# Patient Record
Sex: Female | Born: 1995
Health system: Southern US, Community
[De-identification: ages and names within clinical notes are randomized; demographics above are authoritative.]

## PROBLEM LIST (undated history)

## (undated) DIAGNOSIS — T7422XA Child sexual abuse, confirmed, initial encounter: Secondary | ICD-10-CM

## (undated) DIAGNOSIS — K589 Irritable bowel syndrome without diarrhea: Secondary | ICD-10-CM

## (undated) DIAGNOSIS — F32A Depression, unspecified: Secondary | ICD-10-CM

## (undated) DIAGNOSIS — J45909 Unspecified asthma, uncomplicated: Secondary | ICD-10-CM

## (undated) DIAGNOSIS — G43909 Migraine, unspecified, not intractable, without status migrainosus: Secondary | ICD-10-CM

## (undated) DIAGNOSIS — A749 Chlamydial infection, unspecified: Secondary | ICD-10-CM

## (undated) DIAGNOSIS — G4700 Insomnia, unspecified: Secondary | ICD-10-CM

## (undated) DIAGNOSIS — F419 Anxiety disorder, unspecified: Secondary | ICD-10-CM

## (undated) DIAGNOSIS — F329 Major depressive disorder, single episode, unspecified: Secondary | ICD-10-CM

## (undated) HISTORY — DX: Child sexual abuse, confirmed, initial encounter: T74.22XA

## (undated) HISTORY — DX: Irritable bowel syndrome, unspecified: K58.9

## (undated) HISTORY — DX: Depression, unspecified: F32.A

## (undated) HISTORY — DX: Major depressive disorder, single episode, unspecified: F32.9

## (undated) HISTORY — PX: APPENDECTOMY: SHX54

## (undated) HISTORY — DX: Anxiety disorder, unspecified: F41.9

## (undated) HISTORY — DX: Migraine, unspecified, not intractable, without status migrainosus: G43.909

## (undated) HISTORY — DX: Insomnia, unspecified: G47.00

## (undated) HISTORY — PX: LAPAROSCOPY: SHX197

---
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
## 2016-02-10 ENCOUNTER — Encounter: Payer: Self-pay | Admitting: Family Medicine

## 2016-02-10 ENCOUNTER — Ambulatory Visit (INDEPENDENT_AMBULATORY_CARE_PROVIDER_SITE_OTHER): Payer: Medicaid Other | Admitting: Family Medicine

## 2016-02-10 VITALS — BP 102/70 | HR 104 | Temp 98.4°F | Resp 18 | Wt 128.9 lb

## 2016-02-10 DIAGNOSIS — Z9889 Other specified postprocedural states: Secondary | ICD-10-CM

## 2016-02-10 DIAGNOSIS — M25361 Other instability, right knee: Secondary | ICD-10-CM

## 2016-02-10 DIAGNOSIS — G4700 Insomnia, unspecified: Secondary | ICD-10-CM

## 2016-02-10 DIAGNOSIS — R42 Dizziness and giddiness: Secondary | ICD-10-CM

## 2016-02-10 DIAGNOSIS — J3089 Other allergic rhinitis: Secondary | ICD-10-CM

## 2016-02-10 DIAGNOSIS — F339 Major depressive disorder, recurrent, unspecified: Secondary | ICD-10-CM | POA: Diagnosis not present

## 2016-02-10 DIAGNOSIS — D509 Iron deficiency anemia, unspecified: Secondary | ICD-10-CM

## 2016-02-10 DIAGNOSIS — J309 Allergic rhinitis, unspecified: Secondary | ICD-10-CM

## 2016-02-10 DIAGNOSIS — J4520 Mild intermittent asthma, uncomplicated: Secondary | ICD-10-CM | POA: Diagnosis not present

## 2016-02-10 DIAGNOSIS — J302 Other seasonal allergic rhinitis: Secondary | ICD-10-CM

## 2016-02-10 DIAGNOSIS — Z13220 Encounter for screening for lipoid disorders: Secondary | ICD-10-CM | POA: Diagnosis not present

## 2016-02-10 MED ORDER — DULOXETINE HCL 60 MG PO CPEP: 60.0000 mg | ORAL_CAPSULE | Freq: Every day | ORAL | Status: DC

## 2016-02-10 MED ORDER — CETIRIZINE HCL 10 MG PO TABS: 10.0000 mg | ORAL_TABLET | Freq: Every day | ORAL | Status: DC
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
## 2016-02-18 ENCOUNTER — Other Ambulatory Visit: Payer: Self-pay | Admitting: Family Medicine

## 2016-02-18 LAB — CBC WITH DIFFERENTIAL/PLATELET
BASOS: 0 %
Basophils Absolute: 0 10*3/uL (ref 0.0–0.2)
EOS (ABSOLUTE): 0.1 10*3/uL (ref 0.0–0.4)
Eos: 1 %
Hematocrit: 29.8 % — ABNORMAL LOW (ref 34.0–46.6)
Hemoglobin: 9.2 g/dL — ABNORMAL LOW (ref 11.1–15.9)
IMMATURE GRANULOCYTES: 0 %
Immature Grans (Abs): 0 10*3/uL (ref 0.0–0.1)
LYMPHS ABS: 1.9 10*3/uL (ref 0.7–3.1)
Lymphs: 22 %
MCH: 24.3 pg — ABNORMAL LOW (ref 26.6–33.0)
MCHC: 30.9 g/dL — ABNORMAL LOW (ref 31.5–35.7)
MCV: 79 fL (ref 79–97)
Monocytes Absolute: 0.6 10*3/uL (ref 0.1–0.9)
Monocytes: 7 %
NEUTROS PCT: 70 %
Neutrophils Absolute: 6.3 10*3/uL (ref 1.4–7.0)
PLATELETS: 415 10*3/uL — AB (ref 150–379)
RBC: 3.78 x10E6/uL (ref 3.77–5.28)
RDW: 13.7 % (ref 12.3–15.4)
WBC: 8.9 10*3/uL (ref 3.4–10.8)

## 2016-02-18 LAB — LIPID PANEL
CHOL/HDL RATIO: 2.3 ratio (ref 0.0–4.4)
Cholesterol, Total: 128 mg/dL (ref 100–169)
HDL: 56 mg/dL (ref >39)
LDL CALC: 61 mg/dL (ref 0–109)
Triglycerides: 54 mg/dL (ref 0–89)
VLDL CHOLESTEROL CAL: 11 mg/dL (ref 5–40)

## 2016-02-18 LAB — COMPREHENSIVE METABOLIC PANEL
ALT: 16 IU/L (ref 0–32)
AST: 19 IU/L (ref 0–40)
Albumin/Globulin Ratio: 1.9 (ref 1.1–2.5)
Albumin: 4.1 g/dL (ref 3.5–5.5)
Alkaline Phosphatase: 57 IU/L (ref 43–101)
BILIRUBIN TOTAL: 0.3 mg/dL (ref 0.0–1.2)
BUN/Creatinine Ratio: 10 (ref 8–20)
BUN: 7 mg/dL (ref 6–20)
CALCIUM: 9.2 mg/dL (ref 8.7–10.2)
CHLORIDE: 103 mmol/L (ref 96–106)
CO2: 20 mmol/L (ref 18–29)
Creatinine, Ser: 0.7 mg/dL (ref 0.57–1.00)
GFR calc Af Amer: 146 mL/min/{1.73_m2} (ref >59)
GFR calc non Af Amer: 127 mL/min/{1.73_m2} (ref >59)
GLOBULIN, TOTAL: 2.2 g/dL (ref 1.5–4.5)
Glucose: 80 mg/dL (ref 65–99)
POTASSIUM: 4.4 mmol/L (ref 3.5–5.2)
SODIUM: 140 mmol/L (ref 134–144)
Total Protein: 6.3 g/dL (ref 6.0–8.5)

## 2016-02-18 LAB — FERRITIN: Ferritin: 5 ng/mL — ABNORMAL LOW (ref 15–77)

## 2016-02-18 LAB — TSH: TSH: 1.37 u[IU]/mL (ref 0.450–4.500)

## 2016-02-18 MED ORDER — FERROUS SULFATE 325 (65 FE) MG PO TBEC: 325.0000 mg | DELAYED_RELEASE_TABLET | Freq: Three times a day (TID) | ORAL | Status: DC

## 2016-04-15 ENCOUNTER — Ambulatory Visit
Admission: EM | Admit: 2016-04-15 | Discharge: 2016-04-15 | Disposition: A | Discharge status: Home / Self Care | Payer: Medicaid Other | Attending: Family Medicine | Admitting: Family Medicine

## 2016-04-15 ENCOUNTER — Encounter: Payer: Self-pay | Admitting: Gynecology

## 2016-04-15 DIAGNOSIS — J45909 Unspecified asthma, uncomplicated: Secondary | ICD-10-CM | POA: Insufficient documentation

## 2016-04-15 DIAGNOSIS — J029 Acute pharyngitis, unspecified: Secondary | ICD-10-CM | POA: Diagnosis not present

## 2016-04-15 DIAGNOSIS — G4700 Insomnia, unspecified: Secondary | ICD-10-CM | POA: Insufficient documentation

## 2016-04-15 DIAGNOSIS — F329 Major depressive disorder, single episode, unspecified: Secondary | ICD-10-CM | POA: Insufficient documentation

## 2016-04-15 DIAGNOSIS — K589 Irritable bowel syndrome without diarrhea: Secondary | ICD-10-CM | POA: Insufficient documentation

## 2016-04-15 LAB — RAPID STREP SCREEN (MED CTR MEBANE ONLY): STREPTOCOCCUS, GROUP A SCREEN (DIRECT): NEGATIVE

## 2016-04-15 MED ORDER — IBUPROFEN 800 MG PO TABS: 800.0000 mg | ORAL_TABLET | Freq: Three times a day (TID) | ORAL | Status: DC

## 2016-04-15 NOTE — Discharge Instructions (Signed)
This is viral. Your strep test was negative. Take the ibuprofen as needed. Follow up if you worsen or fail to improve.  Pharyngitis Pharyngitis is redness, pain, and swelling (inflammation) of your pharynx.  CAUSES  Pharyngitis is usually caused by infection. Most of the time, these infections are from viruses (viral) and are part of a cold. However, sometimes pharyngitis is caused by bacteria (bacterial). Pharyngitis can also be caused by allergies. Viral pharyngitis may be spread from person to person by coughing, sneezing, and personal items or utensils (cups, forks, spoons, toothbrushes). Bacterial pharyngitis may be spread from person to person by more intimate contact, such as kissing.  SIGNS AND SYMPTOMS  Symptoms of pharyngitis include:   Sore throat.   Tiredness (fatigue).   Low-grade fever.   Headache.  Joint pain and muscle aches.  Skin rashes.  Swollen lymph nodes.  Plaque-like film on throat or tonsils (often seen with bacterial pharyngitis). DIAGNOSIS  Your health care provider will ask you questions about your illness and your symptoms. Your medical history, along with a physical exam, is often all that is needed to diagnose pharyngitis. Sometimes, a rapid strep test is done. Other lab tests may also be done, depending on the suspected cause.  TREATMENT  Viral pharyngitis will usually get better in 3-4 days without the use of medicine. Bacterial pharyngitis is treated with medicines that kill germs (antibiotics).  HOME CARE INSTRUCTIONS   Drink enough water and fluids to keep your urine clear or pale yellow.   Only take over-the-counter or prescription medicines as directed by your health care provider:   If you are prescribed antibiotics, make sure you finish them even if you start to feel better.   Do not take aspirin.   Get lots of rest.   Gargle with 8 oz of salt water ( tsp of salt per 1 qt of water) as often as every 1-2 hours to soothe your  throat.   Throat lozenges (if you are not at risk for choking) or sprays may be used to soothe your throat. SEEK MEDICAL CARE IF:   You have large, tender lumps in your neck.  You have a rash.  You cough up green, yellow-brown, or bloody spit. SEEK IMMEDIATE MEDICAL CARE IF:   Your neck becomes stiff.  You drool or are unable to swallow liquids.  You vomit or are unable to keep medicines or liquids down.  You have severe pain that does not go away with the use of recommended medicines.  You have trouble breathing (not caused by a stuffy nose). MAKE SURE YOU:   Understand these instructions.  Will watch your condition.  Will get help right away if you are not doing well or get worse.   This information is not intended to replace advice given to you by your health care provider. Make sure you discuss any questions you have with your health care provider.   Document Released: 11/26/2005 Document Revised: 09/16/2013 Document Reviewed: 08/03/2013 Elsevier Interactive Patient Education [employer].
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
## 2016-04-15 NOTE — ED Notes (Signed)
Patient c/o sore throat 3-4 days.

## 2016-04-17 LAB — CULTURE, GROUP A STREP (THRC)

## 2016-05-14 ENCOUNTER — Encounter: Payer: Self-pay | Admitting: Family Medicine

## 2016-05-14 ENCOUNTER — Ambulatory Visit (INDEPENDENT_AMBULATORY_CARE_PROVIDER_SITE_OTHER): Payer: Medicaid Other | Admitting: Family Medicine

## 2016-05-14 VITALS — BP 116/64 | HR 95 | Temp 98.9°F | Resp 18 | Wt 131.1 lb

## 2016-05-14 DIAGNOSIS — D500 Iron deficiency anemia secondary to blood loss (chronic): Secondary | ICD-10-CM | POA: Diagnosis not present

## 2016-05-14 DIAGNOSIS — N920 Excessive and frequent menstruation with regular cycle: Secondary | ICD-10-CM

## 2016-05-14 DIAGNOSIS — N944 Primary dysmenorrhea: Secondary | ICD-10-CM

## 2016-05-14 DIAGNOSIS — F339 Major depressive disorder, recurrent, unspecified: Secondary | ICD-10-CM

## 2016-05-14 DIAGNOSIS — N898 Other specified noninflammatory disorders of vagina: Secondary | ICD-10-CM

## 2016-05-14 DIAGNOSIS — J309 Allergic rhinitis, unspecified: Secondary | ICD-10-CM

## 2016-05-14 DIAGNOSIS — J3089 Other allergic rhinitis: Secondary | ICD-10-CM

## 2016-05-14 DIAGNOSIS — J302 Other seasonal allergic rhinitis: Secondary | ICD-10-CM

## 2016-05-14 DIAGNOSIS — G4700 Insomnia, unspecified: Secondary | ICD-10-CM | POA: Diagnosis not present

## 2016-05-14 DIAGNOSIS — J4520 Mild intermittent asthma, uncomplicated: Secondary | ICD-10-CM | POA: Diagnosis not present

## 2016-05-14 DIAGNOSIS — M25361 Other instability, right knee: Secondary | ICD-10-CM

## 2016-05-14 DIAGNOSIS — Z113 Encounter for screening for infections with a predominantly sexual mode of transmission: Secondary | ICD-10-CM

## 2016-05-14 DIAGNOSIS — L298 Other pruritus: Secondary | ICD-10-CM | POA: Diagnosis not present

## 2016-05-14 MED ORDER — FLUCONAZOLE 150 MG PO TABS: 150.0000 mg | ORAL_TABLET | ORAL | Status: DC

## 2016-05-14 MED ORDER — DULOXETINE HCL 60 MG PO CPEP: 60.0000 mg | ORAL_CAPSULE | Freq: Every day | ORAL | Status: DC

## 2016-05-14 MED ORDER — FERROUS SULFATE 325 (65 FE) MG PO TBEC: 325.0000 mg | DELAYED_RELEASE_TABLET | Freq: Three times a day (TID) | ORAL | Status: DC
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
## 2016-05-15 LAB — HIV ANTIBODY (ROUTINE TESTING W REFLEX): HIV Screen 4th Generation wRfx: NONREACTIVE

## 2016-05-15 LAB — HEMOGLOBIN AND HEMATOCRIT, BLOOD
Hematocrit: 35.8 % (ref 34.0–46.6)
Hemoglobin: 10.5 g/dL — ABNORMAL LOW (ref 11.1–15.9)

## 2016-05-15 LAB — FERRITIN: Ferritin: 6 ng/mL — ABNORMAL LOW (ref 15–77)

## 2016-05-16 LAB — CHLAMYDIA/GONOCOCCUS/TRICHOMONAS, NAA
Chlamydia by NAA: NEGATIVE
Gonococcus by NAA: NEGATIVE
Trich vag by NAA: NEGATIVE

## 2016-06-15 ENCOUNTER — Other Ambulatory Visit: Payer: Self-pay | Admitting: Family Medicine

## 2016-07-05 ENCOUNTER — Telehealth: Payer: Self-pay | Admitting: Family Medicine

## 2016-07-06 NOTE — Telephone Encounter (Signed)
Patient went up to [facility] and spoke with the receptionist and was told to make an appt. Patient made an appointment for Monday 07/09/16 at 9:30 a.m. Due to doctor not being there and when she goes to her appt. Her BC can be changed then.

## 2016-07-16 ENCOUNTER — Encounter: Payer: Self-pay | Admitting: *Deleted

## 2016-07-16 ENCOUNTER — Ambulatory Visit
Admission: EM | Admit: 2016-07-16 | Discharge: 2016-07-16 | Disposition: A | Discharge status: Home / Self Care | Payer: Medicaid Other | Attending: Family Medicine | Admitting: Family Medicine

## 2016-07-16 DIAGNOSIS — Z91410 Personal history of adult physical and sexual abuse: Secondary | ICD-10-CM | POA: Diagnosis not present

## 2016-07-16 DIAGNOSIS — F339 Major depressive disorder, recurrent, unspecified: Secondary | ICD-10-CM | POA: Diagnosis not present

## 2016-07-16 DIAGNOSIS — K581 Irritable bowel syndrome with constipation: Secondary | ICD-10-CM | POA: Insufficient documentation

## 2016-07-16 DIAGNOSIS — G4700 Insomnia, unspecified: Secondary | ICD-10-CM | POA: Insufficient documentation

## 2016-07-16 DIAGNOSIS — D500 Iron deficiency anemia secondary to blood loss (chronic): Secondary | ICD-10-CM | POA: Diagnosis not present

## 2016-07-16 DIAGNOSIS — J4520 Mild intermittent asthma, uncomplicated: Secondary | ICD-10-CM | POA: Diagnosis not present

## 2016-07-16 DIAGNOSIS — R350 Frequency of micturition: Secondary | ICD-10-CM | POA: Diagnosis present

## 2016-07-16 DIAGNOSIS — R300 Dysuria: Secondary | ICD-10-CM | POA: Diagnosis present

## 2016-07-16 DIAGNOSIS — N390 Urinary tract infection, site not specified: Secondary | ICD-10-CM | POA: Insufficient documentation

## 2016-07-16 DIAGNOSIS — R3915 Urgency of urination: Secondary | ICD-10-CM | POA: Diagnosis present

## 2016-07-16 LAB — URINALYSIS COMPLETE WITH MICROSCOPIC (ARMC ONLY)
BILIRUBIN URINE: NEGATIVE
GLUCOSE, UA: NEGATIVE mg/dL
Ketones, ur: NEGATIVE mg/dL
Nitrite: NEGATIVE
Protein, ur: 100 mg/dL — AB
Specific Gravity, Urine: 1.025 (ref 1.005–1.030)
pH: 5.5 (ref 5.0–8.0)

## 2016-07-16 LAB — PREGNANCY, URINE: PREG TEST UR: NEGATIVE

## 2016-07-16 MED ORDER — SULFAMETHOXAZOLE-TRIMETHOPRIM 800-160 MG PO TABS: 1.0000 | ORAL_TABLET | Freq: Two times a day (BID) | ORAL | 0 refills | Status: AC

## 2016-07-16 NOTE — ED Triage Notes (Signed)
Dysuria, urinary urgency/frequency since Saturday.

## 2016-07-16 NOTE — Discharge Instructions (Signed)
Take medication as prescribed. Rest. Drink plenty of fluids.  ° °Follow up with your primary care physician this week as discussed. Return to Urgent care for new or worsening concerns.  ° °
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
## 2016-07-18 LAB — URINE CULTURE

## 2016-08-07 ENCOUNTER — Encounter: Payer: Self-pay | Admitting: Emergency Medicine

## 2016-08-07 ENCOUNTER — Ambulatory Visit
Admission: EM | Admit: 2016-08-07 | Discharge: 2016-08-07 | Disposition: A | Discharge status: Home / Self Care | Payer: Medicaid Other | Attending: Family Medicine | Admitting: Family Medicine

## 2016-08-07 DIAGNOSIS — A499 Bacterial infection, unspecified: Secondary | ICD-10-CM

## 2016-08-07 DIAGNOSIS — B9689 Other specified bacterial agents as the cause of diseases classified elsewhere: Secondary | ICD-10-CM

## 2016-08-07 DIAGNOSIS — N898 Other specified noninflammatory disorders of vagina: Secondary | ICD-10-CM | POA: Diagnosis present

## 2016-08-07 DIAGNOSIS — N760 Acute vaginitis: Secondary | ICD-10-CM

## 2016-08-07 LAB — CHLAMYDIA/NGC RT PCR (ARMC ONLY)
CHLAMYDIA TR: NOT DETECTED
N GONORRHOEAE: NOT DETECTED

## 2016-08-07 LAB — PREGNANCY, URINE: PREG TEST UR: NEGATIVE

## 2016-08-07 LAB — WET PREP, GENITAL
Sperm: NONE SEEN
Trich, Wet Prep: NONE SEEN
YEAST WET PREP: NONE SEEN

## 2016-08-07 MED ORDER — METRONIDAZOLE 500 MG PO TABS: 500.0000 mg | ORAL_TABLET | Freq: Two times a day (BID) | ORAL | 0 refills | Status: DC
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
## 2016-08-07 NOTE — ED Triage Notes (Signed)
Patient denies itching.  Patient reports vaginal discharge for a week.

## 2016-08-08 LAB — RPR: RPR Ser Ql: NONREACTIVE

## 2016-08-08 LAB — HIV ANTIBODY (ROUTINE TESTING W REFLEX): HIV Screen 4th Generation wRfx: NONREACTIVE

## 2016-08-09 ENCOUNTER — Telehealth: Payer: Self-pay | Admitting: *Deleted

## 2016-08-09 NOTE — Telephone Encounter (Signed)
Called patient and informed her that her RPR HIV GC and chlamydia results came back negative. Patient confirmed understanding of information.

## 2016-08-14 ENCOUNTER — Ambulatory Visit (INDEPENDENT_AMBULATORY_CARE_PROVIDER_SITE_OTHER): Payer: Medicaid Other | Admitting: Family Medicine

## 2016-08-14 ENCOUNTER — Encounter: Payer: Self-pay | Admitting: Family Medicine

## 2016-08-14 VITALS — BP 118/60 | HR 111 | Temp 98.5°F | Resp 18 | Ht 67.0 in | Wt 127.5 lb

## 2016-08-14 DIAGNOSIS — R55 Syncope and collapse: Secondary | ICD-10-CM

## 2016-08-14 DIAGNOSIS — J302 Other seasonal allergic rhinitis: Secondary | ICD-10-CM

## 2016-08-14 DIAGNOSIS — F339 Major depressive disorder, recurrent, unspecified: Secondary | ICD-10-CM | POA: Diagnosis not present

## 2016-08-14 DIAGNOSIS — G4700 Insomnia, unspecified: Secondary | ICD-10-CM

## 2016-08-14 DIAGNOSIS — L819 Disorder of pigmentation, unspecified: Secondary | ICD-10-CM

## 2016-08-14 DIAGNOSIS — J4520 Mild intermittent asthma, uncomplicated: Secondary | ICD-10-CM | POA: Diagnosis not present

## 2016-08-14 DIAGNOSIS — J309 Allergic rhinitis, unspecified: Secondary | ICD-10-CM | POA: Diagnosis not present

## 2016-08-14 DIAGNOSIS — Z23 Encounter for immunization: Secondary | ICD-10-CM | POA: Diagnosis not present

## 2016-08-14 DIAGNOSIS — L818 Other specified disorders of pigmentation: Secondary | ICD-10-CM

## 2016-08-14 DIAGNOSIS — S83001D Unspecified subluxation of right patella, subsequent encounter: Secondary | ICD-10-CM | POA: Diagnosis not present

## 2016-08-14 DIAGNOSIS — D500 Iron deficiency anemia secondary to blood loss (chronic): Secondary | ICD-10-CM | POA: Diagnosis not present

## 2016-08-14 DIAGNOSIS — J3089 Other allergic rhinitis: Secondary | ICD-10-CM

## 2016-08-14 LAB — CBC WITH DIFFERENTIAL/PLATELET
Basophils Absolute: 0 cells/uL (ref 0–200)
Basophils Relative: 0 %
Eosinophils Absolute: 0 cells/uL — ABNORMAL LOW (ref 15–500)
Eosinophils Relative: 0 %
HCT: 35.6 % (ref 35.0–45.0)
Hemoglobin: 11 g/dL — ABNORMAL LOW (ref 11.7–15.5)
Lymphocytes Relative: 18 %
Lymphs Abs: 1422 cells/uL (ref 850–3900)
MCH: 23.5 pg — ABNORMAL LOW (ref 27.0–33.0)
MCHC: 30.9 g/dL — ABNORMAL LOW (ref 32.0–36.0)
MCV: 76.1 fL — ABNORMAL LOW (ref 80.0–100.0)
MPV: 10.2 fL (ref 7.5–12.5)
Monocytes Absolute: 553 cells/uL (ref 200–950)
Monocytes Relative: 7 %
Neutro Abs: 5925 cells/uL (ref 1500–7800)
Neutrophils Relative %: 75 %
Platelets: 414 10*3/uL — ABNORMAL HIGH (ref 140–400)
RBC: 4.68 MIL/uL (ref 3.80–5.10)
RDW: 15.2 % — ABNORMAL HIGH (ref 11.0–15.0)
WBC: 7.9 10*3/uL (ref 3.8–10.8)

## 2016-08-14 LAB — FERRITIN: Ferritin: 9 ng/mL (ref 6–67)

## 2016-08-14 MED ORDER — DULOXETINE HCL 60 MG PO CPEP: 60.0000 mg | ORAL_CAPSULE | Freq: Every day | ORAL | 2 refills | Status: DC
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
## 2016-08-15 ENCOUNTER — Other Ambulatory Visit: Payer: Self-pay | Admitting: Family Medicine

## 2016-08-15 DIAGNOSIS — R55 Syncope and collapse: Secondary | ICD-10-CM

## 2016-08-16 HISTORY — PX: KNEE SURGERY: SHX244

## 2016-08-22 ENCOUNTER — Other Ambulatory Visit: Payer: Self-pay | Admitting: Family Medicine

## 2016-08-22 DIAGNOSIS — J302 Other seasonal allergic rhinitis: Secondary | ICD-10-CM

## 2016-08-22 DIAGNOSIS — N920 Excessive and frequent menstruation with regular cycle: Secondary | ICD-10-CM

## 2016-08-22 DIAGNOSIS — J3089 Other allergic rhinitis: Principal | ICD-10-CM

## 2016-08-22 NOTE — Telephone Encounter (Signed)
Patient requesting refill of Mononessa and Paxil be sent to [facility].

## 2016-08-22 NOTE — Telephone Encounter (Signed)
Patient requesting refill of Cetirizine to [facility].

## 2016-08-29 ENCOUNTER — Emergency Department: Payer: Medicaid Other

## 2016-08-29 ENCOUNTER — Other Ambulatory Visit: Payer: Self-pay | Admitting: Family Medicine

## 2016-08-29 ENCOUNTER — Emergency Department
Admission: EM | Admit: 2016-08-29 | Discharge: 2016-08-30 | Disposition: A | Discharge status: Home / Self Care | Payer: Medicaid Other | Attending: Emergency Medicine | Admitting: Emergency Medicine

## 2016-08-29 ENCOUNTER — Encounter: Payer: Self-pay | Admitting: Radiology

## 2016-08-29 DIAGNOSIS — R079 Chest pain, unspecified: Secondary | ICD-10-CM | POA: Insufficient documentation

## 2016-08-29 DIAGNOSIS — J45909 Unspecified asthma, uncomplicated: Secondary | ICD-10-CM | POA: Diagnosis not present

## 2016-08-29 DIAGNOSIS — R0600 Dyspnea, unspecified: Secondary | ICD-10-CM | POA: Diagnosis not present

## 2016-08-29 DIAGNOSIS — Z79899 Other long term (current) drug therapy: Secondary | ICD-10-CM | POA: Insufficient documentation

## 2016-08-29 DIAGNOSIS — R0602 Shortness of breath: Secondary | ICD-10-CM | POA: Insufficient documentation

## 2016-08-29 DIAGNOSIS — M79604 Pain in right leg: Secondary | ICD-10-CM | POA: Diagnosis not present

## 2016-08-29 DIAGNOSIS — Z791 Long term (current) use of non-steroidal anti-inflammatories (NSAID): Secondary | ICD-10-CM | POA: Insufficient documentation

## 2016-08-29 LAB — CBC WITH DIFFERENTIAL/PLATELET
BASOS PCT: 0 %
Basophils Absolute: 0 10*3/uL (ref 0–0.1)
Eosinophils Absolute: 0 10*3/uL (ref 0–0.7)
Eosinophils Relative: 0 %
HEMATOCRIT: 32 % — AB (ref 35.0–47.0)
HEMOGLOBIN: 10.3 g/dL — AB (ref 12.0–16.0)
LYMPHS ABS: 2 10*3/uL (ref 1.0–3.6)
LYMPHS PCT: 21 %
MCH: 24.1 pg — AB (ref 26.0–34.0)
MCHC: 32.2 g/dL (ref 32.0–36.0)
MCV: 74.7 fL — AB (ref 80.0–100.0)
MONO ABS: 0.6 10*3/uL (ref 0.2–0.9)
Monocytes Relative: 6 %
NEUTROS ABS: 7 10*3/uL — AB (ref 1.4–6.5)
Neutrophils Relative %: 73 %
Platelets: 474 10*3/uL — ABNORMAL HIGH (ref 150–440)
RBC: 4.28 MIL/uL (ref 3.80–5.20)
RDW: 15 % — AB (ref 11.5–14.5)
WBC: 9.7 10*3/uL (ref 3.6–11.0)

## 2016-08-29 LAB — BASIC METABOLIC PANEL
Anion gap: 7 (ref 5–15)
BUN: 10 mg/dL (ref 6–20)
CALCIUM: 9.4 mg/dL (ref 8.9–10.3)
CHLORIDE: 110 mmol/L (ref 101–111)
CO2: 23 mmol/L (ref 22–32)
CREATININE: 0.61 mg/dL (ref 0.44–1.00)
GFR calc Af Amer: >60 mL/min (ref >60)
GFR calc non Af Amer: >60 mL/min (ref >60)
GLUCOSE: 119 mg/dL — AB (ref 65–99)
Potassium: 3.4 mmol/L — ABNORMAL LOW (ref 3.5–5.1)
Sodium: 140 mmol/L (ref 135–145)

## 2016-08-29 LAB — FIBRIN DERIVATIVES D-DIMER (ARMC ONLY): Fibrin derivatives D-dimer (ARMC): 1314 — ABNORMAL HIGH (ref 0–499)

## 2016-08-29 LAB — HCG, QUANTITATIVE, PREGNANCY

## 2016-08-29 LAB — TROPONIN I: Troponin I: <0.03 ng/mL (ref <0.03)

## 2016-08-29 MED ORDER — FAMOTIDINE 20 MG PO TABS: 20.0000 mg | ORAL_TABLET | Freq: Two times a day (BID) | ORAL | 1 refills | Status: DC

## 2016-08-29 MED ORDER — ONDANSETRON HCL 4 MG/2ML IJ SOLN
4.0000 mg | Freq: Once | INTRAMUSCULAR | Status: AC
  Administered 2016-08-29: 4 mg via INTRAVENOUS

## 2016-08-29 MED ORDER — TRAMADOL HCL 50 MG PO TABS: 50.0000 mg | ORAL_TABLET | Freq: Four times a day (QID) | ORAL | 0 refills | Status: DC | PRN

## 2016-08-29 MED ORDER — ONDANSETRON 4 MG PO TBDP: 4.0000 mg | ORAL_TABLET | Freq: Once | ORAL | Status: DC

## 2016-08-29 MED ORDER — MORPHINE SULFATE (PF) 4 MG/ML IV SOLN
4.0000 mg | Freq: Once | INTRAVENOUS | Status: AC
  Administered 2016-08-29: 4 mg via INTRAVENOUS
  Filled 2016-08-29: qty 1

## 2016-08-29 MED ORDER — IOPAMIDOL (ISOVUE-370) INJECTION 76%
75.0000 mL | Freq: Once | INTRAVENOUS | Status: AC | PRN
  Administered 2016-08-29: 75 mL via INTRAVENOUS

## 2016-08-29 MED ORDER — ONDANSETRON HCL 4 MG/2ML IJ SOLN
INTRAMUSCULAR | Status: AC
Start: 2016-08-29 — End: 2016-08-29
  Administered 2016-08-29: 4 mg via INTRAVENOUS
  Filled 2016-08-29: qty 2

## 2016-08-29 MED ORDER — ONDANSETRON HCL 4 MG/2ML IJ SOLN
4.0000 mg | Freq: Once | INTRAMUSCULAR | Status: DC
  Filled 2016-08-29: qty 2

## 2016-08-29 NOTE — ED Notes (Signed)
Pt. Transported to CT 

## 2016-08-29 NOTE — ED Triage Notes (Signed)
Pt in with chest pain that goes through to her back that started a few days ago with co shob. Pt has right knee surgery 9/7 pt has had decreased mobility.

## 2016-08-29 NOTE — ED Notes (Signed)
Pt. Returned to tx. room in stable condition with no acute changes since departure from unit for scans.   

## 2016-08-29 NOTE — Telephone Encounter (Signed)
Patient requesting refill of Proair to [facility].

## 2016-08-29 NOTE — ED Notes (Signed)
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
## 2016-09-06 ENCOUNTER — Ambulatory Visit (INDEPENDENT_AMBULATORY_CARE_PROVIDER_SITE_OTHER): Payer: Medicaid Other | Admitting: Neurology

## 2016-09-06 ENCOUNTER — Encounter: Payer: Self-pay | Admitting: Neurology

## 2016-09-06 DIAGNOSIS — IMO0002 Reserved for concepts with insufficient information to code with codable children: Secondary | ICD-10-CM | POA: Insufficient documentation

## 2016-09-06 DIAGNOSIS — R55 Syncope and collapse: Secondary | ICD-10-CM

## 2016-09-06 DIAGNOSIS — G43709 Chronic migraine without aura, not intractable, without status migrainosus: Secondary | ICD-10-CM | POA: Diagnosis not present

## 2016-09-06 MED ORDER — TOPIRAMATE 50 MG PO TABS: 50.0000 mg | ORAL_TABLET | Freq: Two times a day (BID) | ORAL | 11 refills | Status: DC

## 2016-09-06 MED ORDER — RIZATRIPTAN BENZOATE 5 MG PO TBDP: 5.0000 mg | ORAL_TABLET | ORAL | 11 refills | Status: DC | PRN
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
## 2016-09-11 ENCOUNTER — Emergency Department
Admission: EM | Admit: 2016-09-11 | Discharge: 2016-09-12 | Disposition: A | Discharge status: Home / Self Care | Payer: Medicaid Other | Attending: Emergency Medicine | Admitting: Emergency Medicine

## 2016-09-11 ENCOUNTER — Encounter: Payer: Self-pay | Admitting: Emergency Medicine

## 2016-09-11 DIAGNOSIS — Z791 Long term (current) use of non-steroidal anti-inflammatories (NSAID): Secondary | ICD-10-CM | POA: Diagnosis not present

## 2016-09-11 DIAGNOSIS — R51 Headache: Secondary | ICD-10-CM | POA: Insufficient documentation

## 2016-09-11 DIAGNOSIS — R079 Chest pain, unspecified: Secondary | ICD-10-CM | POA: Insufficient documentation

## 2016-09-11 DIAGNOSIS — R55 Syncope and collapse: Secondary | ICD-10-CM | POA: Diagnosis present

## 2016-09-11 DIAGNOSIS — Z79899 Other long term (current) drug therapy: Secondary | ICD-10-CM | POA: Insufficient documentation

## 2016-09-11 DIAGNOSIS — J45909 Unspecified asthma, uncomplicated: Secondary | ICD-10-CM | POA: Insufficient documentation

## 2016-09-11 DIAGNOSIS — R0602 Shortness of breath: Secondary | ICD-10-CM | POA: Diagnosis not present

## 2016-09-11 DIAGNOSIS — Z7951 Long term (current) use of inhaled steroids: Secondary | ICD-10-CM | POA: Insufficient documentation

## 2016-09-11 NOTE — ED Triage Notes (Addendum)
Pt to triage via w/c with no distress noted; pt reports onset dizziness while in shower, then had syncopal episode; c/o frontal HA and persistent dizziness at present; ; denies any recent illness, denies hx of same; denies any injuries from syncopal episode; st hx migraines

## 2016-09-12 ENCOUNTER — Emergency Department: Payer: Medicaid Other

## 2016-09-12 LAB — URINALYSIS COMPLETE WITH MICROSCOPIC (ARMC ONLY)
Bilirubin Urine: NEGATIVE
Glucose, UA: NEGATIVE mg/dL
Hgb urine dipstick: NEGATIVE
Ketones, ur: NEGATIVE mg/dL
Nitrite: NEGATIVE
PH: 6 (ref 5.0–8.0)
PROTEIN: 30 mg/dL — AB
Specific Gravity, Urine: 1.023 (ref 1.005–1.030)

## 2016-09-12 LAB — BASIC METABOLIC PANEL
ANION GAP: 8 (ref 5–15)
BUN: 10 mg/dL (ref 6–20)
CALCIUM: 9.8 mg/dL (ref 8.9–10.3)
CO2: 24 mmol/L (ref 22–32)
Chloride: 110 mmol/L (ref 101–111)
Creatinine, Ser: 0.68 mg/dL (ref 0.44–1.00)
GFR calc Af Amer: >60 mL/min (ref >60)
GFR calc non Af Amer: >60 mL/min (ref >60)
GLUCOSE: 81 mg/dL (ref 65–99)
Potassium: 3.9 mmol/L (ref 3.5–5.1)
Sodium: 142 mmol/L (ref 135–145)

## 2016-09-12 LAB — TROPONIN I: Troponin I: <0.03 ng/mL (ref <0.03)

## 2016-09-12 LAB — CBC
HEMATOCRIT: 36.5 % (ref 35.0–47.0)
HEMOGLOBIN: 11.8 g/dL — AB (ref 12.0–16.0)
MCH: 24.5 pg — AB (ref 26.0–34.0)
MCHC: 32.4 g/dL (ref 32.0–36.0)
MCV: 75.8 fL — ABNORMAL LOW (ref 80.0–100.0)
Platelets: 454 10*3/uL — ABNORMAL HIGH (ref 150–440)
RBC: 4.82 MIL/uL (ref 3.80–5.20)
RDW: 16.1 % — AB (ref 11.5–14.5)
WBC: 12.5 10*3/uL — ABNORMAL HIGH (ref 3.6–11.0)

## 2016-09-12 LAB — URINE DRUG SCREEN, QUALITATIVE (ARMC ONLY)
Amphetamines, Ur Screen: NOT DETECTED — AB
BENZODIAZEPINE, UR SCRN: NOT DETECTED — AB
Barbiturates, Ur Screen: NOT DETECTED — AB
Cannabinoid 50 Ng, Ur ~~LOC~~: NOT DETECTED — AB
Cocaine Metabolite,Ur ~~LOC~~: NOT DETECTED — AB
MDMA (ECSTASY) UR SCREEN: NOT DETECTED — AB
METHADONE SCREEN, URINE: NOT DETECTED — AB
Opiate, Ur Screen: NOT DETECTED — AB
Phencyclidine (PCP) Ur S: NOT DETECTED — AB
TRICYCLIC, UR SCREEN: NOT DETECTED — AB

## 2016-09-12 LAB — POCT PREGNANCY, URINE: PREG TEST UR: NEGATIVE

## 2016-09-12 MED ORDER — BUTALBITAL-APAP-CAFFEINE 50-325-40 MG PO TABS
ORAL_TABLET | ORAL | Status: AC
  Administered 2016-09-12: 2 via ORAL
  Filled 2016-09-12: qty 2

## 2016-09-12 MED ORDER — BUTALBITAL-APAP-CAFFEINE 50-325-40 MG PO TABS
2.0000 | ORAL_TABLET | Freq: Once | ORAL | Status: AC
  Administered 2016-09-12: 2 via ORAL

## 2016-09-12 NOTE — ED Notes (Signed)
Patient transported to CT 

## 2016-09-12 NOTE — ED Notes (Signed)
Patient urged to urinate, collection bowl placed in toilet.
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
## 2016-09-12 NOTE — ED Notes (Addendum)
I contacted Radiology to see what is the delay on interpretation of this patient's images.

## 2016-09-17 ENCOUNTER — Ambulatory Visit (INDEPENDENT_AMBULATORY_CARE_PROVIDER_SITE_OTHER): Payer: Medicaid Other | Admitting: Neurology

## 2016-09-17 DIAGNOSIS — R55 Syncope and collapse: Secondary | ICD-10-CM

## 2016-09-17 DIAGNOSIS — G43709 Chronic migraine without aura, not intractable, without status migrainosus: Secondary | ICD-10-CM

## 2016-09-17 DIAGNOSIS — IMO0002 Reserved for concepts with insufficient information to code with codable children: Secondary | ICD-10-CM

## 2016-09-18 ENCOUNTER — Ambulatory Visit
Admission: RE | Admit: 2016-09-18 | Discharge: 2016-09-18 | Disposition: A | Discharge status: Home / Self Care | Payer: Medicaid Other | Source: Ambulatory Visit | Attending: Neurology | Admitting: Neurology

## 2016-09-18 DIAGNOSIS — G43709 Chronic migraine without aura, not intractable, without status migrainosus: Secondary | ICD-10-CM

## 2016-09-18 DIAGNOSIS — R55 Syncope and collapse: Secondary | ICD-10-CM | POA: Diagnosis not present

## 2016-09-18 DIAGNOSIS — IMO0002 Reserved for concepts with insufficient information to code with codable children: Secondary | ICD-10-CM

## 2016-09-18 MED ORDER — GADOBENATE DIMEGLUMINE 529 MG/ML IV SOLN
10.0000 mL | Freq: Once | INTRAVENOUS | Status: AC | PRN
  Administered 2016-09-18: 10 mL via INTRAVENOUS

## 2016-09-20 ENCOUNTER — Telehealth: Payer: Self-pay

## 2016-09-20 NOTE — Telephone Encounter (Signed)
Tried to call pt again but unreachable and not able to leave mssg. If/when she calls again, can let her know that MRI was normal.

## 2016-09-20 NOTE — Telephone Encounter (Signed)
Pt returned call

## 2016-09-20 NOTE — Telephone Encounter (Signed)
Attempted to reach pt to give MRI results but unsuccessful. VM box is not set up to leave mssg. Will try again later.

## 2016-09-20 NOTE — Telephone Encounter (Signed)
-----   Message from Marcial Pacas, MD sent at 09/20/2016  9:11 AM EDT ----- Please call pt for normal MRI brain.
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
## 2016-09-27 ENCOUNTER — Other Ambulatory Visit: Payer: Medicaid Other

## 2016-10-04 ENCOUNTER — Other Ambulatory Visit: Payer: Self-pay | Admitting: Family Medicine

## 2016-10-04 DIAGNOSIS — N920 Excessive and frequent menstruation with regular cycle: Secondary | ICD-10-CM

## 2016-10-04 NOTE — Telephone Encounter (Signed)
Patient requesting refill of Mononessa and Proair to [facility].

## 2016-10-15 ENCOUNTER — Telehealth: Payer: Self-pay | Admitting: Family Medicine

## 2016-10-15 ENCOUNTER — Other Ambulatory Visit: Payer: Self-pay | Admitting: Family Medicine

## 2016-10-15 MED ORDER — TRAZODONE HCL 50 MG PO TABS: 25.0000 mg | ORAL_TABLET | Freq: Every evening | ORAL | 0 refills | Status: DC | PRN

## 2016-10-15 NOTE — Telephone Encounter (Signed)
Pt was on trazodone 50 mg tablet

## 2016-10-15 NOTE — Telephone Encounter (Signed)
Patient is having a hard time sleeping and is wanting to know if you could put her back on her sleeping medication (not sure of the name but you had taken her off of it). Please send to [facility]

## 2016-10-15 NOTE — Telephone Encounter (Signed)
done

## 2016-10-16 NOTE — Telephone Encounter (Signed)
Patient verbally informed °

## 2016-10-22 ENCOUNTER — Encounter: Payer: Self-pay | Admitting: *Deleted

## 2016-10-22 ENCOUNTER — Ambulatory Visit: Payer: Medicaid Other

## 2016-10-22 ENCOUNTER — Ambulatory Visit
Admission: EM | Admit: 2016-10-22 | Discharge: 2016-10-22 | Disposition: A | Discharge status: Home / Self Care | Payer: Medicaid Other | Attending: Family Medicine | Admitting: Family Medicine

## 2016-10-22 DIAGNOSIS — R3915 Urgency of urination: Secondary | ICD-10-CM | POA: Diagnosis present

## 2016-10-22 DIAGNOSIS — R300 Dysuria: Secondary | ICD-10-CM | POA: Insufficient documentation

## 2016-10-22 DIAGNOSIS — R109 Unspecified abdominal pain: Secondary | ICD-10-CM

## 2016-10-22 LAB — URINALYSIS COMPLETE WITH MICROSCOPIC (ARMC ONLY)
Bilirubin Urine: NEGATIVE
GLUCOSE, UA: NEGATIVE mg/dL
KETONES UR: NEGATIVE mg/dL
LEUKOCYTES UA: NEGATIVE
NITRITE: NEGATIVE
Protein, ur: 100 mg/dL — AB
pH: 6 (ref 5.0–8.0)

## 2016-10-22 LAB — PREGNANCY, URINE: PREG TEST UR: NEGATIVE

## 2016-10-22 MED ORDER — CIPROFLOXACIN HCL 500 MG PO TABS: 500.0000 mg | ORAL_TABLET | Freq: Two times a day (BID) | ORAL | 0 refills | Status: AC

## 2016-10-22 NOTE — Discharge Instructions (Signed)
Take medication as prescribed. Rest. Drink plenty of fluids.  ° °Follow up with your primary care physician this week. Return to Urgent care for new or worsening concerns.  ° °
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
## 2016-10-22 NOTE — ED Triage Notes (Signed)
Urinary urgency, and left flank pain x2 days. Denies fever, nausea. One previous UTI.

## 2016-10-25 ENCOUNTER — Telehealth: Payer: Self-pay | Admitting: *Deleted

## 2016-10-25 LAB — URINE CULTURE

## 2016-10-25 NOTE — Telephone Encounter (Signed)
Called patient, verified DOB, communicated positive urine culture results. Patient reported feeling much better. Advised patient to follow up with PCP or [facility] if symptoms return.

## 2016-11-10 ENCOUNTER — Emergency Department
Admission: EM | Admit: 2016-11-10 | Discharge: 2016-11-10 | Disposition: A | Discharge status: Home / Self Care | Payer: Medicaid Other | Attending: Emergency Medicine | Admitting: Emergency Medicine

## 2016-11-10 ENCOUNTER — Encounter: Payer: Self-pay | Admitting: Emergency Medicine

## 2016-11-10 ENCOUNTER — Emergency Department: Payer: Medicaid Other

## 2016-11-10 DIAGNOSIS — Y999 Unspecified external cause status: Secondary | ICD-10-CM | POA: Diagnosis not present

## 2016-11-10 DIAGNOSIS — J45909 Unspecified asthma, uncomplicated: Secondary | ICD-10-CM | POA: Insufficient documentation

## 2016-11-10 DIAGNOSIS — Y9389 Activity, other specified: Secondary | ICD-10-CM | POA: Diagnosis not present

## 2016-11-10 DIAGNOSIS — Y929 Unspecified place or not applicable: Secondary | ICD-10-CM | POA: Diagnosis not present

## 2016-11-10 DIAGNOSIS — S8992XA Unspecified injury of left lower leg, initial encounter: Secondary | ICD-10-CM | POA: Diagnosis present

## 2016-11-10 DIAGNOSIS — T148XXA Other injury of unspecified body region, initial encounter: Secondary | ICD-10-CM

## 2016-11-10 DIAGNOSIS — S86912A Strain of unspecified muscle(s) and tendon(s) at lower leg level, left leg, initial encounter: Secondary | ICD-10-CM | POA: Insufficient documentation

## 2016-11-10 DIAGNOSIS — X58XXXA Exposure to other specified factors, initial encounter: Secondary | ICD-10-CM | POA: Diagnosis not present

## 2016-11-10 DIAGNOSIS — Z79899 Other long term (current) drug therapy: Secondary | ICD-10-CM | POA: Diagnosis not present

## 2016-11-10 DIAGNOSIS — M79662 Pain in left lower leg: Secondary | ICD-10-CM

## 2016-11-10 MED ORDER — NAPROXEN 500 MG PO TABS: 500.0000 mg | ORAL_TABLET | Freq: Two times a day (BID) | ORAL | 0 refills | Status: DC

## 2016-11-10 MED ORDER — BACLOFEN 10 MG PO TABS: 10.0000 mg | ORAL_TABLET | Freq: Three times a day (TID) | ORAL | 0 refills | Status: DC

## 2016-11-10 NOTE — ED Triage Notes (Signed)
Pt to ed with c/o right leg pain.  Pt states it staretd after physical therapy on Thursday, worse pain today than yesterday, especially with ambulation.
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
## 2016-11-10 NOTE — ED Notes (Signed)
See triage note. Pt states she thinks she pulled a muscle in her R calf after PT on Thursday. States she is unable to put weight on R leg and it is tender to palpation.

## 2016-11-10 NOTE — ED Notes (Signed)
Patient transported to Ultrasound 

## 2016-11-13 ENCOUNTER — Ambulatory Visit (INDEPENDENT_AMBULATORY_CARE_PROVIDER_SITE_OTHER): Payer: Medicaid Other | Admitting: Family Medicine

## 2016-11-13 ENCOUNTER — Encounter: Payer: Self-pay | Admitting: Family Medicine

## 2016-11-13 VITALS — BP 112/64 | HR 86 | Temp 98.4°F | Resp 16 | Ht 67.0 in | Wt 133.5 lb

## 2016-11-13 DIAGNOSIS — L819 Disorder of pigmentation, unspecified: Secondary | ICD-10-CM

## 2016-11-13 DIAGNOSIS — R55 Syncope and collapse: Secondary | ICD-10-CM

## 2016-11-13 DIAGNOSIS — L818 Other specified disorders of pigmentation: Secondary | ICD-10-CM | POA: Diagnosis not present

## 2016-11-13 DIAGNOSIS — D500 Iron deficiency anemia secondary to blood loss (chronic): Secondary | ICD-10-CM | POA: Diagnosis not present

## 2016-11-13 DIAGNOSIS — G4709 Other insomnia: Secondary | ICD-10-CM | POA: Diagnosis not present

## 2016-11-13 DIAGNOSIS — K625 Hemorrhage of anus and rectum: Secondary | ICD-10-CM

## 2016-11-13 DIAGNOSIS — F339 Major depressive disorder, recurrent, unspecified: Secondary | ICD-10-CM | POA: Diagnosis not present

## 2016-11-13 DIAGNOSIS — Z9889 Other specified postprocedural states: Secondary | ICD-10-CM | POA: Diagnosis not present

## 2016-11-13 DIAGNOSIS — K581 Irritable bowel syndrome with constipation: Secondary | ICD-10-CM | POA: Diagnosis not present

## 2016-11-13 DIAGNOSIS — J4520 Mild intermittent asthma, uncomplicated: Secondary | ICD-10-CM | POA: Diagnosis not present

## 2016-11-13 LAB — POC HEMOCCULT BLD/STL (OFFICE/1-CARD/DIAGNOSTIC): FECAL OCCULT BLD: NEGATIVE

## 2016-11-13 MED ORDER — TRAZODONE HCL 50 MG PO TABS: 25.0000 mg | ORAL_TABLET | Freq: Every evening | ORAL | 5 refills | Status: DC | PRN

## 2016-11-13 MED ORDER — DULOXETINE HCL 60 MG PO CPEP: 60.0000 mg | ORAL_CAPSULE | Freq: Every day | ORAL | 5 refills | Status: DC

## 2016-11-13 MED ORDER — HYDROCORTISONE ACETATE 25 MG RE SUPP: 25.0000 mg | Freq: Two times a day (BID) | RECTAL | 0 refills | Status: DC
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
## 2016-12-13 ENCOUNTER — Encounter: Payer: Self-pay | Admitting: Neurology

## 2016-12-13 ENCOUNTER — Ambulatory Visit (INDEPENDENT_AMBULATORY_CARE_PROVIDER_SITE_OTHER): Payer: Medicaid Other | Admitting: Neurology

## 2016-12-13 VITALS — BP 107/75 | HR 84 | Ht 67.0 in | Wt 131.0 lb

## 2016-12-13 DIAGNOSIS — IMO0002 Reserved for concepts with insufficient information to code with codable children: Secondary | ICD-10-CM

## 2016-12-13 DIAGNOSIS — R55 Syncope and collapse: Secondary | ICD-10-CM

## 2016-12-13 DIAGNOSIS — G43709 Chronic migraine without aura, not intractable, without status migrainosus: Secondary | ICD-10-CM
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
## 2017-01-28 ENCOUNTER — Other Ambulatory Visit: Payer: Self-pay | Admitting: Family Medicine

## 2017-01-28 DIAGNOSIS — J302 Other seasonal allergic rhinitis: Secondary | ICD-10-CM

## 2017-01-28 DIAGNOSIS — J3089 Other allergic rhinitis: Principal | ICD-10-CM

## 2017-01-28 NOTE — Telephone Encounter (Signed)
Patient requesting refill of Zyrtec to [facility].

## 2017-03-23 IMAGING — CT CT ANGIO CHEST
2 of 6 series · 19 of 46 positions shown · IV contrast (APPLIED)
Comparison: None.

CLINICAL DATA: Chest pain through to the back starting a few days
ago. Shortness of breath. Right knee surgery on 08/16/2016.
Increased D-dimer.

EXAM:
CT ANGIOGRAPHY CHEST WITH CONTRAST
TECHNIQUE: Multidetector CT imaging of the chest was performed using the
standard protocol during bolus administration of intravenous
contrast. Multiplanar CT image reconstructions and MIPs were
obtained to evaluate the vascular anatomy.
CONTRAST:  75 mL Isovue 370

[Series 5: thins · axial · 0.63mm/px · z∈[-622,-404]mm · 17 of 240 slices shown]
[im 11/240  lung]
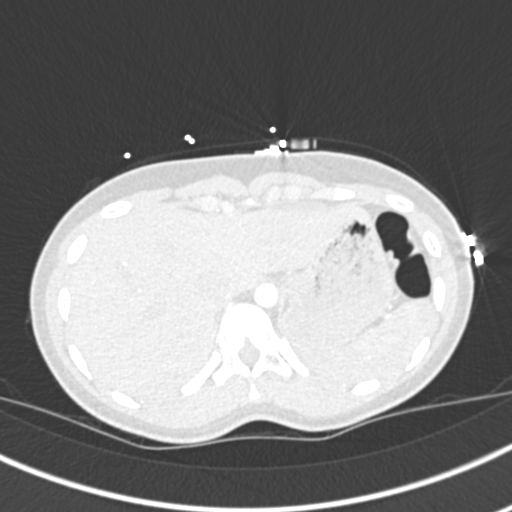
[im 21/240  soft-tissue]
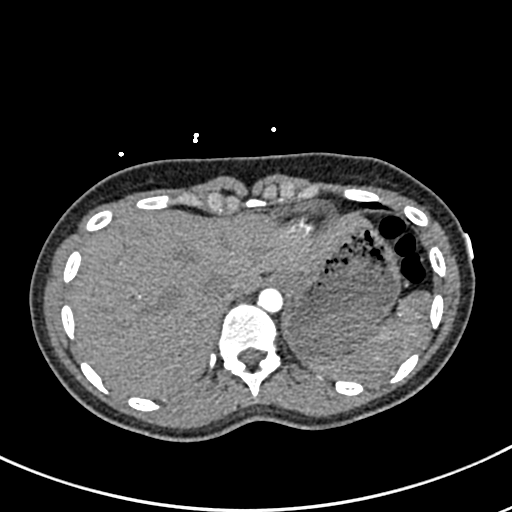
[im 42/240  lung]
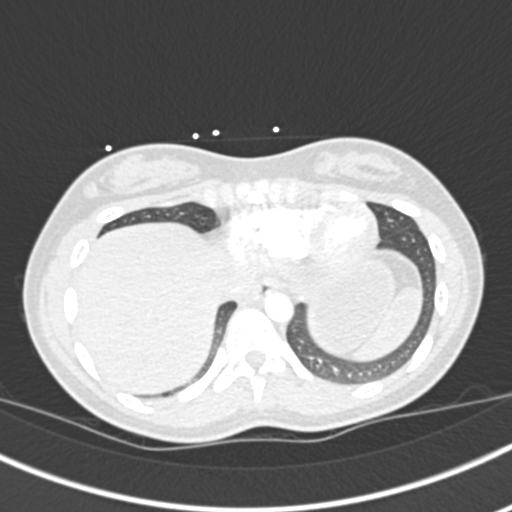
[im 52/240  soft-tissue]
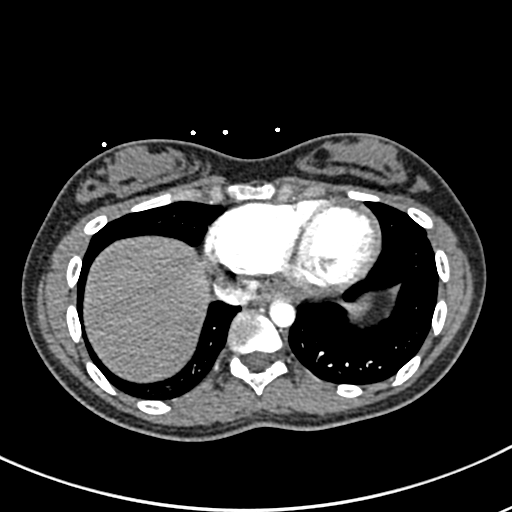
[im 63/240  lung]
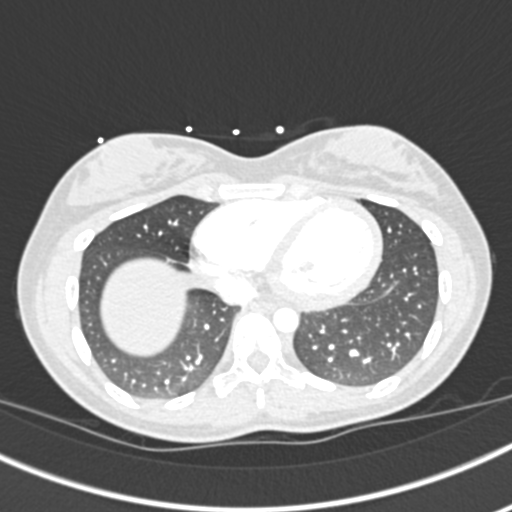
[im 84/240  soft-tissue]
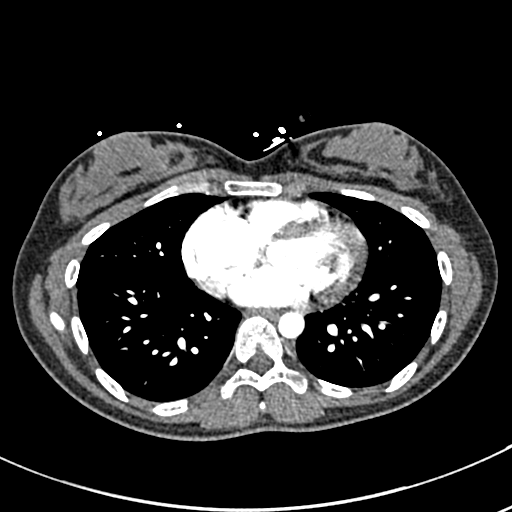
[im 94/240  lung]
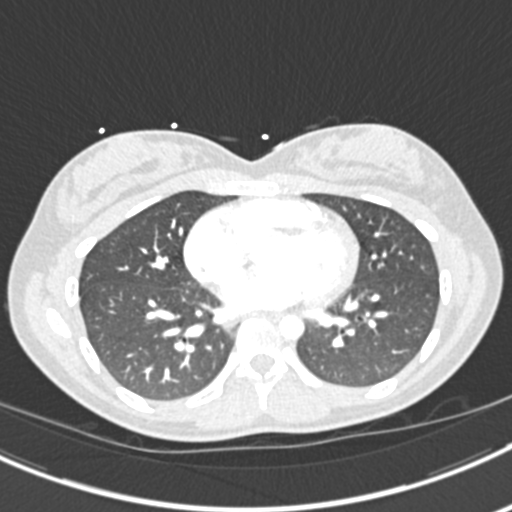
[im 104/240  soft-tissue]
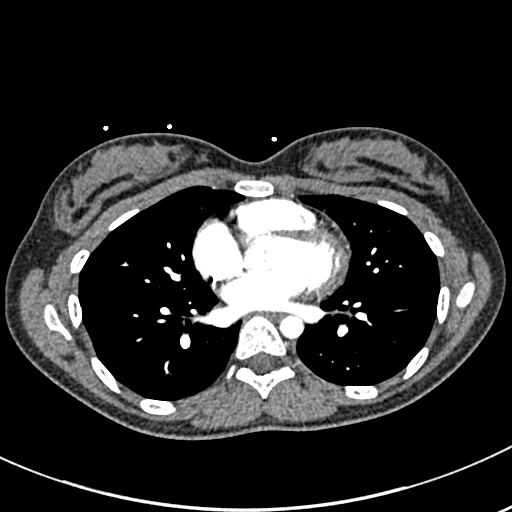
[im 125/240  lung]
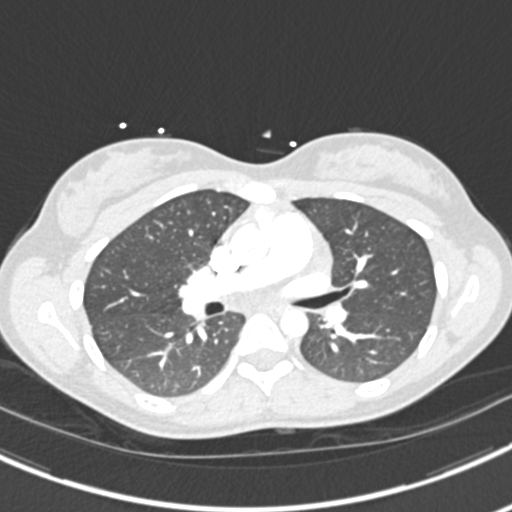
[im 136/240  soft-tissue]
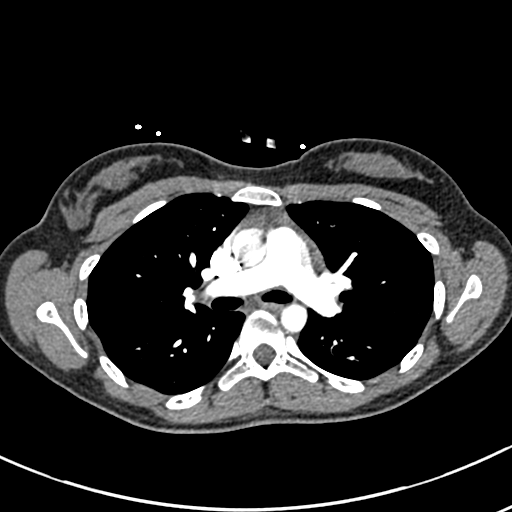
[im 146/240  lung]
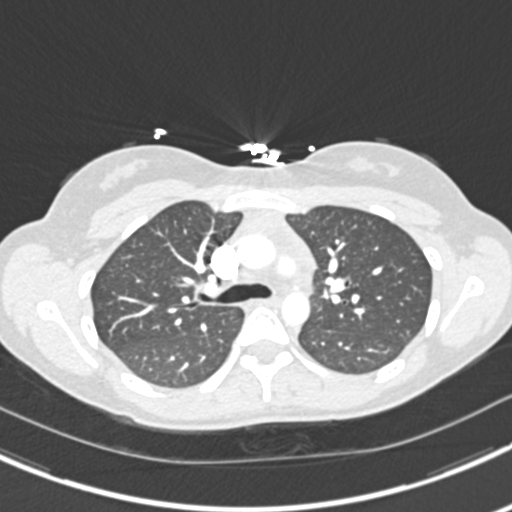
[im 156/240  soft-tissue]
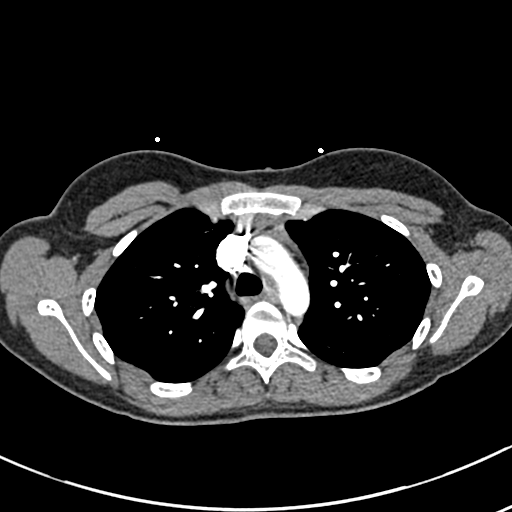
[im 177/240  lung]
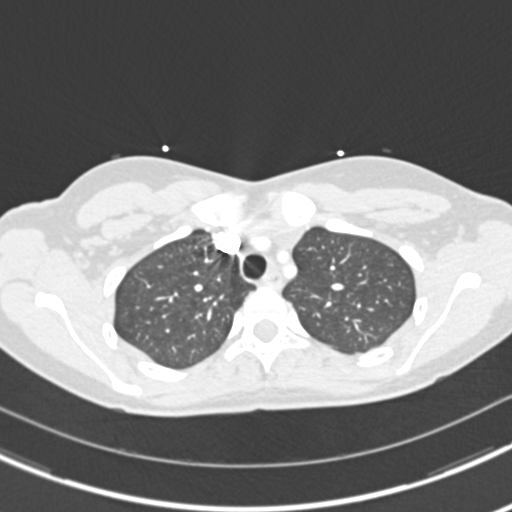
[im 188/240  soft-tissue]
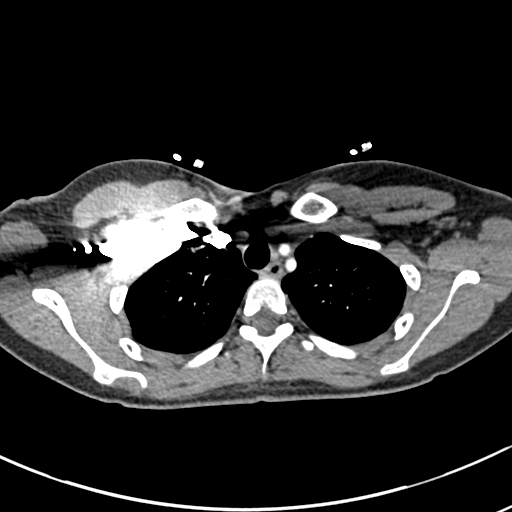
[im 198/240  lung]
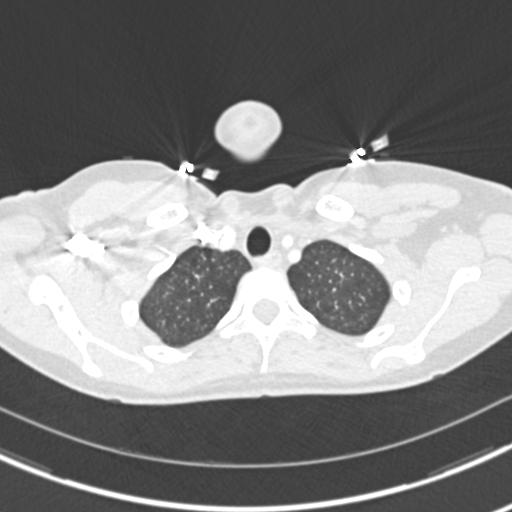
[im 219/240  soft-tissue]
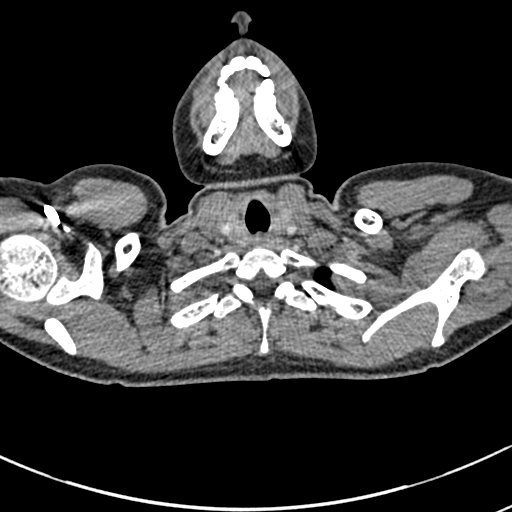
[im 229/240  lung]
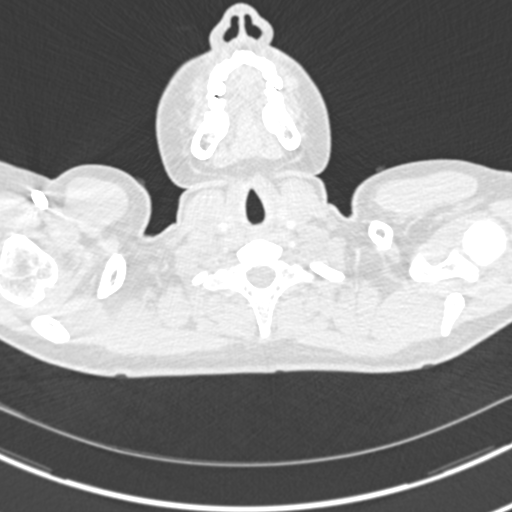

[Series 7: coronal mpr · coronal · 0.49mm/px · 2 of 55 slices shown]
[im 19/55  soft-tissue]
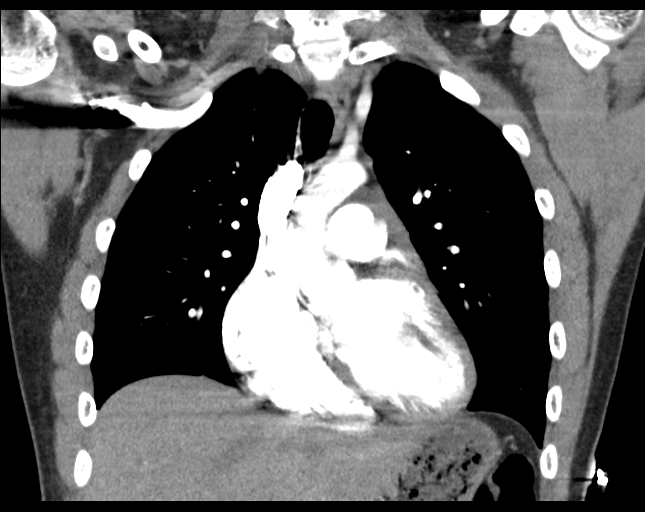
[im 37/55  soft-tissue]
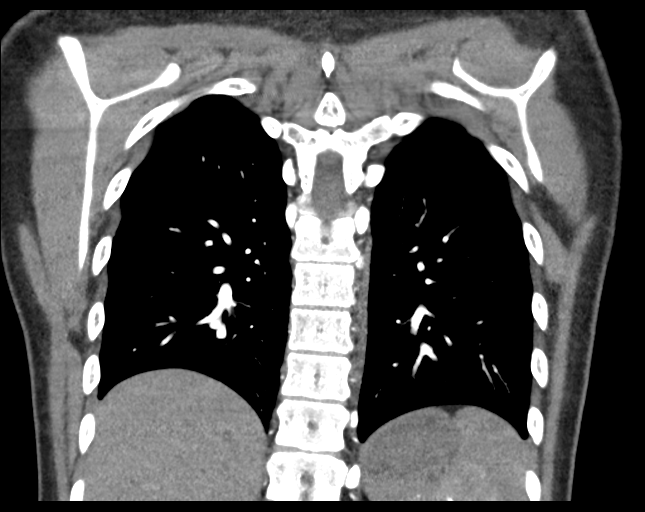

[19 of 46 positions shown; findings below may reference images not displayed]

FINDINGS: Cardiovascular: Satisfactory opacification of the pulmonary arteries
to the segmental level. No evidence of pulmonary embolism. Normal
heart size. No pericardial effusion.

Mediastinum/Nodes: No enlarged mediastinal, hilar, or axillary lymph
nodes. Thyroid gland, trachea, and esophagus demonstrate no
significant findings. Residual thymic tissue in the anterior
mediastinum.

Lungs/Pleura: Lungs are clear. No pleural effusion or pneumothorax.

Upper Abdomen: No acute abnormality.

Musculoskeletal: No chest wall abnormality. No acute or significant
osseous findings.

Review of the MIP images confirms the above findings.
IMPRESSION: No evidence of significant pulmonary embolus. No evidence of active
pulmonary disease.

## 2017-04-13 ENCOUNTER — Ambulatory Visit: Payer: Self-pay

## 2017-04-13 ENCOUNTER — Ambulatory Visit
Admission: EM | Admit: 2017-04-13 | Discharge: 2017-04-13 | Disposition: A | Discharge status: Home / Self Care | Payer: Self-pay | Attending: Family Medicine | Admitting: Family Medicine

## 2017-04-13 ENCOUNTER — Encounter: Payer: Self-pay | Admitting: Emergency Medicine

## 2017-04-13 DIAGNOSIS — Z91410 Personal history of adult physical and sexual abuse: Secondary | ICD-10-CM | POA: Insufficient documentation

## 2017-04-13 DIAGNOSIS — S060X0A Concussion without loss of consciousness, initial encounter: Secondary | ICD-10-CM

## 2017-04-13 DIAGNOSIS — F339 Major depressive disorder, recurrent, unspecified: Secondary | ICD-10-CM | POA: Insufficient documentation

## 2017-04-13 DIAGNOSIS — F419 Anxiety disorder, unspecified: Secondary | ICD-10-CM | POA: Insufficient documentation

## 2017-04-13 DIAGNOSIS — G43909 Migraine, unspecified, not intractable, without status migrainosus: Secondary | ICD-10-CM | POA: Insufficient documentation

## 2017-04-13 DIAGNOSIS — G4700 Insomnia, unspecified: Secondary | ICD-10-CM | POA: Insufficient documentation

## 2017-04-13 DIAGNOSIS — L239 Allergic contact dermatitis, unspecified cause: Secondary | ICD-10-CM | POA: Insufficient documentation

## 2017-04-13 DIAGNOSIS — K581 Irritable bowel syndrome with constipation: Secondary | ICD-10-CM | POA: Insufficient documentation

## 2017-04-13 DIAGNOSIS — J4520 Mild intermittent asthma, uncomplicated: Secondary | ICD-10-CM | POA: Insufficient documentation

## 2017-04-13 DIAGNOSIS — W182XXA Fall in (into) shower or empty bathtub, initial encounter: Secondary | ICD-10-CM | POA: Insufficient documentation

## 2017-04-13 DIAGNOSIS — G44309 Post-traumatic headache, unspecified, not intractable: Secondary | ICD-10-CM

## 2017-04-13 DIAGNOSIS — Y92214 College as the place of occurrence of the external cause: Secondary | ICD-10-CM | POA: Insufficient documentation

## 2017-04-13 DIAGNOSIS — Y93E1 Activity, personal bathing and showering: Secondary | ICD-10-CM | POA: Insufficient documentation

## 2017-04-13 DIAGNOSIS — S0990XA Unspecified injury of head, initial encounter: Secondary | ICD-10-CM

## 2017-04-13 LAB — PREGNANCY, URINE: Preg Test, Ur: NEGATIVE

## 2017-04-13 MED ORDER — ONDANSETRON 8 MG PO TBDP
8.0000 mg | ORAL_TABLET | Freq: Once | ORAL | Status: AC
  Administered 2017-04-13: 8 mg via ORAL

## 2017-04-13 MED ORDER — ONDANSETRON 8 MG PO TBDP: 8.0000 mg | ORAL_TABLET | Freq: Three times a day (TID) | ORAL | 0 refills | Status: DC | PRN

## 2017-04-13 NOTE — ED Triage Notes (Signed)
Headache for 3 days, Fell 4  days ago
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
## 2017-04-30 ENCOUNTER — Ambulatory Visit (INDEPENDENT_AMBULATORY_CARE_PROVIDER_SITE_OTHER): Payer: Self-pay | Admitting: Family Medicine

## 2017-04-30 ENCOUNTER — Encounter: Payer: Self-pay | Admitting: Family Medicine

## 2017-04-30 ENCOUNTER — Telehealth: Payer: Self-pay | Admitting: Family Medicine

## 2017-04-30 VITALS — BP 110/60 | HR 80 | Temp 98.3°F | Resp 16 | Ht 67.0 in | Wt 132.8 lb

## 2017-04-30 DIAGNOSIS — Z7251 High risk heterosexual behavior: Secondary | ICD-10-CM

## 2017-04-30 DIAGNOSIS — B3731 Acute candidiasis of vulva and vagina: Secondary | ICD-10-CM

## 2017-04-30 DIAGNOSIS — S00521A Blister (nonthermal) of lip, initial encounter: Secondary | ICD-10-CM

## 2017-04-30 DIAGNOSIS — N898 Other specified noninflammatory disorders of vagina: Secondary | ICD-10-CM

## 2017-04-30 DIAGNOSIS — B373 Candidiasis of vulva and vagina: Secondary | ICD-10-CM

## 2017-04-30 MED ORDER — FLUCONAZOLE 150 MG PO TABS: 150.0000 mg | ORAL_TABLET | Freq: Once | ORAL | 0 refills | Status: AC

## 2017-04-30 MED ORDER — VALACYCLOVIR HCL 1 G PO TABS: 2000.0000 mg | ORAL_TABLET | Freq: Two times a day (BID) | ORAL | 0 refills | Status: DC

## 2017-04-30 NOTE — Telephone Encounter (Signed)
Spoke with [facility] - Patient has [employer] only and requires Prior Auth for Valtrex and Diflucan.  Telephone/Verbal Order for Rx for 1-150mg  Diflucan PO to be taken x1 today and 4-1000mg  Valtrex to be taken as 2000mg  BID x1 day given; cash price $15.     Spoke with patient regarding this issue and she is comfortable with plan of care.
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
## 2017-04-30 NOTE — Patient Instructions (Addendum)
Cold Sore A cold sore, also called a fever blister, is a skin infection that is caused by a virus. This infection causes small, fluid-filled sores to form inside of the mouth or on the lips, gums, nose, chin, or cheeks. Cold sores can spread to other parts of the body, such as the eyes or fingers. Cold sores can be spread or passed from person to person (contagious) until the sores crust over completely. Cold sores can be spread through close contact, such as kissing or sharing a drinking glass. Follow these instructions at home: Medicines   Take or apply over-the-counter and prescription medicines only as told by your doctor.  Use a cotton-tip swab to apply creams or gels to your sores. Sore Care   Do not touch the sores or pick the scabs.  Wash your hands often. Do not touch your eyes without washing your hands first.  Keep the sores clean and dry.  If directed, apply ice to the sores:  Put ice in a plastic bag.  Place a towel between your skin and the bag.  Leave the ice on for 20 minutes, 2-3 times per day. Lifestyle   Do not kiss, have oral sex, or share personal items until your sores heal.  Eat a soft, bland diet. Avoid eating hot, cold, or salty foods. These can hurt your mouth.  Use a straw if it hurts to drink out of a glass.  Avoid the sun and limit your stress if these things trigger outbreaks. If sun causes cold sores, apply sunscreen on your lips before being out in the sun. Contact a doctor if:  You have symptoms for more than two weeks.  You have pus coming from the sores.  You have redness that is spreading.  You have pain or irritation in your eye.  You get sores on your genitals.  Your sores do not heal within two weeks.  You get cold sores often. Get help right away if:  You have a fever and your symptoms suddenly get worse.  You have a headache and confusion. This information is not intended to replace advice given to you by your health care  provider. Make sure you discuss any questions you have with your health care provider. Document Released: 05/27/2012 Document Revised: 05/03/2016 Document Reviewed: 09/16/2015 Elsevier Interactive Patient Education  2017 [employer].

## 2017-05-01 LAB — GC/CHLAMYDIA PROBE AMP
CT Probe RNA: NOT DETECTED
GC Probe RNA: NOT DETECTED

## 2017-05-01 LAB — RPR

## 2017-05-01 LAB — HIV ANTIBODY (ROUTINE TESTING W REFLEX): HIV: NONREACTIVE

## 2017-05-01 NOTE — Progress Notes (Signed)
Hello Ms. Longstreth,  Gonorrhea, Chlamydia, HIV, and Syphilis are all negative.  I hope you are starting to feel better, but please let me know if your symptoms are not improving.  Please call if you have any questions or concerns.  Thank you, Raelyn Ensign NP-C

## 2017-05-03 LAB — HSV 1/2 AB (IGM), IFA W/RFLX TITER
HSV 1 IgM Screen: NEGATIVE
HSV 2 IgM Screen: NEGATIVE

## 2017-05-29 ENCOUNTER — Encounter: Payer: Self-pay | Admitting: Emergency Medicine

## 2017-05-29 DIAGNOSIS — N939 Abnormal uterine and vaginal bleeding, unspecified: Secondary | ICD-10-CM | POA: Insufficient documentation

## 2017-05-29 DIAGNOSIS — Z79899 Other long term (current) drug therapy: Secondary | ICD-10-CM | POA: Insufficient documentation

## 2017-05-29 DIAGNOSIS — M545 Low back pain: Secondary | ICD-10-CM | POA: Insufficient documentation

## 2017-05-29 DIAGNOSIS — J45909 Unspecified asthma, uncomplicated: Secondary | ICD-10-CM | POA: Insufficient documentation

## 2017-05-29 LAB — COMPREHENSIVE METABOLIC PANEL
ALBUMIN: 4.4 g/dL (ref 3.5–5.0)
ALT: 17 U/L (ref 14–54)
AST: 22 U/L (ref 15–41)
Alkaline Phosphatase: 74 U/L (ref 38–126)
Anion gap: 4 — ABNORMAL LOW (ref 5–15)
BILIRUBIN TOTAL: 0.5 mg/dL (ref 0.3–1.2)
BUN: 12 mg/dL (ref 6–20)
CHLORIDE: 109 mmol/L (ref 101–111)
CO2: 26 mmol/L (ref 22–32)
Calcium: 9.3 mg/dL (ref 8.9–10.3)
Creatinine, Ser: 0.8 mg/dL (ref 0.44–1.00)
GFR calc Af Amer: >60 mL/min (ref >60)
GFR calc non Af Amer: >60 mL/min (ref >60)
GLUCOSE: 113 mg/dL — AB (ref 65–99)
POTASSIUM: 3.1 mmol/L — AB (ref 3.5–5.1)
Sodium: 139 mmol/L (ref 135–145)
Total Protein: 7.3 g/dL (ref 6.5–8.1)

## 2017-05-29 LAB — CBC
HEMATOCRIT: 36.5 % (ref 35.0–47.0)
Hemoglobin: 12 g/dL (ref 12.0–16.0)
MCH: 26.9 pg (ref 26.0–34.0)
MCHC: 32.8 g/dL (ref 32.0–36.0)
MCV: 82.1 fL (ref 80.0–100.0)
PLATELETS: 286 10*3/uL (ref 150–440)
RBC: 4.45 MIL/uL (ref 3.80–5.20)
RDW: 15 % — AB (ref 11.5–14.5)
WBC: 10.6 10*3/uL (ref 3.6–11.0)

## 2017-05-29 NOTE — ED Triage Notes (Signed)
Patient ambulatory to triage with steady gait, without difficulty or distress noted; pt reports vag bleeding/cramping x 2wks; also with HA to temples

## 2017-05-30 ENCOUNTER — Emergency Department
Admission: EM | Admit: 2017-05-30 | Discharge: 2017-05-30 | Disposition: A | Discharge status: Home / Self Care | Payer: Medicaid Other | Attending: Emergency Medicine | Admitting: Emergency Medicine

## 2017-05-30 DIAGNOSIS — N939 Abnormal uterine and vaginal bleeding, unspecified: Secondary | ICD-10-CM

## 2017-05-30 DIAGNOSIS — M545 Low back pain, unspecified: Secondary | ICD-10-CM

## 2017-05-30 LAB — URINALYSIS, COMPLETE (UACMP) WITH MICROSCOPIC
Bacteria, UA: NONE SEEN
Bilirubin Urine: NEGATIVE
GLUCOSE, UA: NEGATIVE mg/dL
Hgb urine dipstick: NEGATIVE
KETONES UR: NEGATIVE mg/dL
LEUKOCYTES UA: NEGATIVE
Nitrite: NEGATIVE
PH: 6.5 (ref 5.0–8.0)
Specific Gravity, Urine: 1.015 (ref 1.005–1.030)

## 2017-05-30 LAB — POCT PREGNANCY, URINE: Preg Test, Ur: NEGATIVE

## 2017-05-30 NOTE — Discharge Instructions (Signed)
Results for orders placed or performed during the hospital encounter of 05/30/17  Urinalysis, Complete w Microscopic  Result Value Ref Range   Color, Urine YELLOW YELLOW   APPearance CLEAR CLEAR   Specific Gravity, Urine 1.015 1.005 - 1.030   pH 6.5 5.0 - 8.0   Glucose, UA NEGATIVE NEGATIVE mg/dL   Hgb urine dipstick NEGATIVE NEGATIVE   Bilirubin Urine NEGATIVE NEGATIVE   Ketones, ur NEGATIVE NEGATIVE mg/dL   Protein, ur TRACE (A) NEGATIVE mg/dL   Nitrite NEGATIVE NEGATIVE   Leukocytes, UA NEGATIVE NEGATIVE   Squamous Epithelial / LPF 0-5 (A) NONE SEEN   WBC, UA 0-5 0 - 5 WBC/hpf   RBC / HPF 0-5 0 - 5 RBC/hpf   Bacteria, UA NONE SEEN NONE SEEN   Mucous PRESENT   CBC  Result Value Ref Range   WBC 10.6 3.6 - 11.0 K/uL   RBC 4.45 3.80 - 5.20 MIL/uL   Hemoglobin 12.0 12.0 - 16.0 g/dL   HCT 36.5 35.0 - 47.0 %   MCV 82.1 80.0 - 100.0 fL   MCH 26.9 26.0 - 34.0 pg   MCHC 32.8 32.0 - 36.0 g/dL   RDW 15.0 (H) 11.5 - 14.5 %   Platelets 286 150 - 440 K/uL  Comprehensive metabolic panel  Result Value Ref Range   Sodium 139 135 - 145 mmol/L   Potassium 3.1 (L) 3.5 - 5.1 mmol/L   Chloride 109 101 - 111 mmol/L   CO2 26 22 - 32 mmol/L   Glucose, Bld 113 (H) 65 - 99 mg/dL   BUN 12 6 - 20 mg/dL   Creatinine, Ser 0.80 0.44 - 1.00 mg/dL   Calcium 9.3 8.9 - 10.3 mg/dL   Total Protein 7.3 6.5 - 8.1 g/dL   Albumin 4.4 3.5 - 5.0 g/dL   AST 22 15 - 41 U/L   ALT 17 14 - 54 U/L   Alkaline Phosphatase 74 38 - 126 U/L   Total Bilirubin 0.5 0.3 - 1.2 mg/dL   GFR calc non Af Amer >60 >60 mL/min   GFR calc Af Amer >60 >60 mL/min   Anion gap 4 (L) 5 - 15  Pregnancy, urine POC  Result Value Ref Range   Preg Test, Ur NEGATIVE NEGATIVE   No results found.
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
## 2017-05-30 NOTE — ED Notes (Signed)
MD at bedside; Pelvic cart at door side.

## 2017-06-05 ENCOUNTER — Ambulatory Visit: Payer: Self-pay | Admitting: Family Medicine

## 2017-06-05 ENCOUNTER — Encounter: Payer: Self-pay | Admitting: Family Medicine

## 2017-06-05 ENCOUNTER — Ambulatory Visit (INDEPENDENT_AMBULATORY_CARE_PROVIDER_SITE_OTHER): Payer: Self-pay | Admitting: Family Medicine

## 2017-06-05 VITALS — BP 110/78 | HR 79 | Temp 98.6°F | Resp 16 | Ht 67.0 in | Wt 134.1 lb

## 2017-06-05 DIAGNOSIS — N921 Excessive and frequent menstruation with irregular cycle: Secondary | ICD-10-CM

## 2017-06-05 DIAGNOSIS — N924 Excessive bleeding in the premenopausal period: Secondary | ICD-10-CM

## 2017-06-05 DIAGNOSIS — Z8349 Family history of other endocrine, nutritional and metabolic diseases: Secondary | ICD-10-CM

## 2017-06-05 LAB — TSH: TSH: 0.81 mIU/L (ref 0.50–4.30)

## 2017-06-05 LAB — HEMOGLOBIN AND HEMATOCRIT, BLOOD
HCT: 40.2 % (ref 35.0–45.0)
Hemoglobin: 12.7 g/dL (ref 11.7–15.5)
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
## 2017-06-05 NOTE — Patient Instructions (Signed)
Please take Aleve OTC twice daily when having vaginal bleeding and/or low back pain.

## 2017-06-21 ENCOUNTER — Ambulatory Visit: Payer: Self-pay | Admitting: Obstetrics and Gynecology

## 2017-07-11 ENCOUNTER — Telehealth: Payer: Self-pay | Admitting: Obstetrics & Gynecology

## 2017-07-11 NOTE — Telephone Encounter (Signed)
Pt needs to schedule appt. Prescription is not authorized

## 2017-07-15 ENCOUNTER — Other Ambulatory Visit: Payer: Self-pay

## 2017-07-15 MED ORDER — MEDROXYPROGESTERONE ACETATE 150 MG/ML IM SUSP: 150.0000 mg | INTRAMUSCULAR | 0 refills | Status: DC

## 2017-07-24 ENCOUNTER — Ambulatory Visit: Payer: Self-pay | Admitting: Obstetrics & Gynecology

## 2017-07-25 ENCOUNTER — Ambulatory Visit: Payer: Self-pay | Admitting: Advanced Practice Midwife

## 2017-09-14 ENCOUNTER — Encounter (HOSPITAL_COMMUNITY): Payer: Self-pay

## 2017-09-14 ENCOUNTER — Ambulatory Visit (HOSPITAL_COMMUNITY)
Admission: EM | Admit: 2017-09-14 | Discharge: 2017-09-14 | Disposition: A | Discharge status: Home / Self Care | Payer: BLUE CROSS/BLUE SHIELD | Attending: Family Medicine | Admitting: Family Medicine

## 2017-09-14 DIAGNOSIS — S92504A Nondisplaced unspecified fracture of right lesser toe(s), initial encounter for closed fracture: Secondary | ICD-10-CM

## 2017-09-14 DIAGNOSIS — W228XXA Striking against or struck by other objects, initial encounter: Secondary | ICD-10-CM | POA: Diagnosis not present

## 2017-09-14 NOTE — Discharge Instructions (Signed)
Wear your cast shoe until you're able to wear your regular shoes.

## 2017-09-14 NOTE — ED Triage Notes (Signed)
Patient presents to [facility] for rt pinky toe injury, pt states she woke up this morning and walked into a wall and hit pinky toe and has been in pain every since.
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
## 2018-02-18 ENCOUNTER — Encounter: Payer: Self-pay | Admitting: Obstetrics and Gynecology

## 2018-02-18 ENCOUNTER — Ambulatory Visit (INDEPENDENT_AMBULATORY_CARE_PROVIDER_SITE_OTHER): Payer: BLUE CROSS/BLUE SHIELD | Admitting: Obstetrics and Gynecology

## 2018-02-18 VITALS — BP 126/60 | HR 92 | Ht 67.0 in | Wt 139.0 lb

## 2018-02-18 DIAGNOSIS — R102 Pelvic and perineal pain unspecified side: Secondary | ICD-10-CM

## 2018-02-18 DIAGNOSIS — N946 Dysmenorrhea, unspecified: Secondary | ICD-10-CM

## 2018-02-18 DIAGNOSIS — Z3009 Encounter for other general counseling and advice on contraception: Secondary | ICD-10-CM | POA: Diagnosis not present
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
## 2018-02-22 LAB — NUSWAB VAGINITIS PLUS (VG+)
Atopobium vaginae: HIGH {score} — AB
BVAB 2: HIGH {score} — AB
Candida albicans, NAA: NEGATIVE
Candida glabrata, NAA: NEGATIVE
Chlamydia trachomatis, NAA: POSITIVE — AB
Megasphaera 1: HIGH {score} — AB
Neisseria gonorrhoeae, NAA: NEGATIVE
Trich vag by NAA: NEGATIVE

## 2018-02-24 ENCOUNTER — Other Ambulatory Visit: Payer: Self-pay | Admitting: Obstetrics and Gynecology

## 2018-02-24 DIAGNOSIS — N760 Acute vaginitis: Secondary | ICD-10-CM

## 2018-02-24 DIAGNOSIS — B9689 Other specified bacterial agents as the cause of diseases classified elsewhere: Secondary | ICD-10-CM

## 2018-02-24 DIAGNOSIS — A749 Chlamydial infection, unspecified: Secondary | ICD-10-CM

## 2018-02-24 MED ORDER — AZITHROMYCIN 500 MG PO TABS: 1000.0000 mg | ORAL_TABLET | Freq: Once | ORAL | 0 refills | Status: AC

## 2018-02-24 MED ORDER — METRONIDAZOLE 0.75 % VA GEL: 1.0000 | Freq: Every day | VAGINAL | 0 refills | Status: AC

## 2018-02-24 NOTE — Progress Notes (Signed)
Call patient 02/24/18 at 13:43, no answer left message asking her to call the office.

## 2018-02-25 ENCOUNTER — Encounter: Payer: Self-pay | Admitting: Obstetrics and Gynecology

## 2018-02-25 ENCOUNTER — Ambulatory Visit (INDEPENDENT_AMBULATORY_CARE_PROVIDER_SITE_OTHER): Payer: BLUE CROSS/BLUE SHIELD | Admitting: Obstetrics and Gynecology

## 2018-02-25 ENCOUNTER — Other Ambulatory Visit: Payer: Self-pay | Admitting: Obstetrics and Gynecology

## 2018-02-25 ENCOUNTER — Ambulatory Visit (INDEPENDENT_AMBULATORY_CARE_PROVIDER_SITE_OTHER): Payer: BLUE CROSS/BLUE SHIELD

## 2018-02-25 VITALS — BP 100/60 | Ht 67.0 in | Wt 143.0 lb

## 2018-02-25 DIAGNOSIS — B9689 Other specified bacterial agents as the cause of diseases classified elsewhere: Secondary | ICD-10-CM | POA: Diagnosis not present

## 2018-02-25 DIAGNOSIS — N83209 Unspecified ovarian cyst, unspecified side: Secondary | ICD-10-CM | POA: Diagnosis not present

## 2018-02-25 DIAGNOSIS — R102 Pelvic and perineal pain: Secondary | ICD-10-CM | POA: Diagnosis not present

## 2018-02-25 DIAGNOSIS — A749 Chlamydial infection, unspecified: Secondary | ICD-10-CM

## 2018-02-25 DIAGNOSIS — N760 Acute vaginitis: Secondary | ICD-10-CM

## 2018-02-25 MED ORDER — METRONIDAZOLE 500 MG PO TABS
500.0000 mg | ORAL_TABLET | Freq: Two times a day (BID) | ORAL | 0 refills | Status: AC
Start: 2018-02-25 — End: 2018-03-04
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
## 2018-02-26 ENCOUNTER — Encounter: Payer: Self-pay | Admitting: Emergency Medicine

## 2018-02-26 ENCOUNTER — Emergency Department: Payer: BLUE CROSS/BLUE SHIELD

## 2018-02-26 ENCOUNTER — Observation Stay: Payer: BLUE CROSS/BLUE SHIELD | Admitting: Anesthesiology

## 2018-02-26 ENCOUNTER — Encounter
Admission: EM | Disposition: A | Discharge status: Home / Self Care | Payer: Self-pay | Source: Home / Self Care | Attending: Emergency Medicine

## 2018-02-26 ENCOUNTER — Other Ambulatory Visit: Payer: Self-pay

## 2018-02-26 ENCOUNTER — Observation Stay
Admission: EM | Admit: 2018-02-26 | Discharge: 2018-02-28 | Disposition: A | Discharge status: Home / Self Care | Payer: BLUE CROSS/BLUE SHIELD | Attending: General Surgery | Admitting: General Surgery

## 2018-02-26 DIAGNOSIS — N83202 Unspecified ovarian cyst, left side: Secondary | ICD-10-CM | POA: Insufficient documentation

## 2018-02-26 DIAGNOSIS — K3580 Unspecified acute appendicitis: Secondary | ICD-10-CM | POA: Diagnosis not present

## 2018-02-26 DIAGNOSIS — F419 Anxiety disorder, unspecified: Secondary | ICD-10-CM | POA: Diagnosis not present

## 2018-02-26 DIAGNOSIS — G4700 Insomnia, unspecified: Secondary | ICD-10-CM | POA: Insufficient documentation

## 2018-02-26 DIAGNOSIS — F329 Major depressive disorder, single episode, unspecified: Secondary | ICD-10-CM | POA: Diagnosis not present

## 2018-02-26 DIAGNOSIS — K589 Irritable bowel syndrome without diarrhea: Secondary | ICD-10-CM | POA: Diagnosis not present

## 2018-02-26 DIAGNOSIS — K36 Other appendicitis: Secondary | ICD-10-CM

## 2018-02-26 DIAGNOSIS — Z23 Encounter for immunization: Secondary | ICD-10-CM | POA: Diagnosis not present

## 2018-02-26 DIAGNOSIS — Z79899 Other long term (current) drug therapy: Secondary | ICD-10-CM | POA: Insufficient documentation

## 2018-02-26 DIAGNOSIS — D270 Benign neoplasm of right ovary: Secondary | ICD-10-CM | POA: Diagnosis not present

## 2018-02-26 DIAGNOSIS — K3589 Other acute appendicitis without perforation or gangrene: Secondary | ICD-10-CM

## 2018-02-26 DIAGNOSIS — K353 Acute appendicitis with localized peritonitis, without perforation or gangrene: Secondary | ICD-10-CM | POA: Diagnosis not present

## 2018-02-26 DIAGNOSIS — R102 Pelvic and perineal pain: Secondary | ICD-10-CM

## 2018-02-26 DIAGNOSIS — K37 Unspecified appendicitis: Secondary | ICD-10-CM | POA: Diagnosis present

## 2018-02-26 HISTORY — DX: Chlamydial infection, unspecified: A74.9

## 2018-02-26 HISTORY — PX: LAPAROSCOPIC APPENDECTOMY: SHX408

## 2018-02-26 HISTORY — PX: LAPAROSCOPIC OVARIAN CYSTECTOMY: SHX6248

## 2018-02-26 LAB — CBC WITH DIFFERENTIAL/PLATELET
BASOS ABS: 0 10*3/uL (ref 0–0.1)
Basophils Relative: 0 %
Eosinophils Absolute: 0.1 10*3/uL (ref 0–0.7)
Eosinophils Relative: 0 %
HCT: 43.3 % (ref 35.0–47.0)
Hemoglobin: 14.2 g/dL (ref 12.0–16.0)
Lymphocytes Relative: 10 %
Lymphs Abs: 1.9 10*3/uL (ref 1.0–3.6)
MCH: 28.5 pg (ref 26.0–34.0)
MCHC: 32.7 g/dL (ref 32.0–36.0)
MCV: 87.1 fL (ref 80.0–100.0)
MONO ABS: 0.6 10*3/uL (ref 0.2–0.9)
Monocytes Relative: 3 %
Neutro Abs: 16.1 10*3/uL — ABNORMAL HIGH (ref 1.4–6.5)
Neutrophils Relative %: 87 %
PLATELETS: 355 10*3/uL (ref 150–440)
RBC: 4.97 MIL/uL (ref 3.80–5.20)
RDW: 13.9 % (ref 11.5–14.5)
WBC: 18.7 10*3/uL — ABNORMAL HIGH (ref 3.6–11.0)

## 2018-02-26 LAB — URINALYSIS, COMPLETE (UACMP) WITH MICROSCOPIC
BILIRUBIN URINE: NEGATIVE
Bacteria, UA: NONE SEEN
GLUCOSE, UA: NEGATIVE mg/dL
HGB URINE DIPSTICK: NEGATIVE
Ketones, ur: 20 mg/dL — AB
LEUKOCYTES UA: NEGATIVE
NITRITE: NEGATIVE
Protein, ur: NEGATIVE mg/dL
SPECIFIC GRAVITY, URINE: 1.038 — AB (ref 1.005–1.030)
pH: 7 (ref 5.0–8.0)

## 2018-02-26 LAB — COMPREHENSIVE METABOLIC PANEL
ALT: 27 U/L (ref 14–54)
AST: 33 U/L (ref 15–41)
Albumin: 4.7 g/dL (ref 3.5–5.0)
Alkaline Phosphatase: 81 U/L (ref 38–126)
Anion gap: 11 (ref 5–15)
BUN: 9 mg/dL (ref 6–20)
CO2: 23 mmol/L (ref 22–32)
Calcium: 9.7 mg/dL (ref 8.9–10.3)
Chloride: 105 mmol/L (ref 101–111)
Creatinine, Ser: 0.72 mg/dL (ref 0.44–1.00)
GFR calc Af Amer: >60 mL/min (ref >60)
Glucose, Bld: 97 mg/dL (ref 65–99)
POTASSIUM: 3.9 mmol/L (ref 3.5–5.1)
Sodium: 139 mmol/L (ref 135–145)
Total Bilirubin: 0.6 mg/dL (ref 0.3–1.2)
Total Protein: 8 g/dL (ref 6.5–8.1)

## 2018-02-26 LAB — PROTIME-INR
INR: 1.07
Prothrombin Time: 13.8 seconds (ref 11.4–15.2)

## 2018-02-26 LAB — LIPASE, BLOOD: LIPASE: 24 U/L (ref 11–51)

## 2018-02-26 LAB — APTT: APTT: 30 s (ref 24–36)

## 2018-02-26 LAB — HCG, QUANTITATIVE, PREGNANCY: hCG, Beta Chain, Quant, S: <1 m[IU]/mL (ref <5)

## 2018-02-26 SURGERY — APPENDECTOMY, LAPAROSCOPIC
Anesthesia: General | Wound class: Contaminated

## 2018-02-26 MED ORDER — HYDROMORPHONE HCL 1 MG/ML IJ SOLN
0.5000 mg | Freq: Once | INTRAMUSCULAR | Status: AC
  Administered 2018-02-26: 0.5 mg via INTRAVENOUS
  Filled 2018-02-26: qty 1

## 2018-02-26 MED ORDER — LIDOCAINE HCL (CARDIAC) 20 MG/ML IV SOLN
INTRAVENOUS | Status: DC | PRN
  Administered 2018-02-26: 30 mg via INTRAVENOUS

## 2018-02-26 MED ORDER — FENTANYL CITRATE (PF) 100 MCG/2ML IJ SOLN
25.0000 ug | INTRAMUSCULAR | Status: DC | PRN
  Administered 2018-02-26 (×4): 25 ug via INTRAVENOUS

## 2018-02-26 MED ORDER — PROPOFOL 10 MG/ML IV BOLUS
INTRAVENOUS | Status: DC | PRN
  Administered 2018-02-26: 130 mg via INTRAVENOUS

## 2018-02-26 MED ORDER — ONDANSETRON HCL 4 MG/2ML IJ SOLN
4.0000 mg | Freq: Four times a day (QID) | INTRAMUSCULAR | Status: DC | PRN
  Administered 2018-02-27: 4 mg via INTRAVENOUS
  Filled 2018-02-26: qty 2

## 2018-02-26 MED ORDER — PROPOFOL 10 MG/ML IV BOLUS
INTRAVENOUS | Status: AC
  Filled 2018-02-26: qty 20

## 2018-02-26 MED ORDER — ONDANSETRON HCL 4 MG/2ML IJ SOLN
INTRAMUSCULAR | Status: DC | PRN
  Administered 2018-02-26: 4 mg via INTRAVENOUS

## 2018-02-26 MED ORDER — BUPIVACAINE-EPINEPHRINE 0.25% -1:200000 IJ SOLN
INTRAMUSCULAR | Status: DC | PRN
  Administered 2018-02-26: 30 mL

## 2018-02-26 MED ORDER — ONDANSETRON HCL 4 MG/2ML IJ SOLN: 4.0000 mg | Freq: Once | INTRAMUSCULAR | Status: DC | PRN

## 2018-02-26 MED ORDER — SUGAMMADEX SODIUM 200 MG/2ML IV SOLN
INTRAVENOUS | Status: AC
  Filled 2018-02-26: qty 2

## 2018-02-26 MED ORDER — OXYCODONE HCL 5 MG PO TABS
10.0000 mg | ORAL_TABLET | ORAL | Status: DC | PRN
  Administered 2018-02-27 (×2): 10 mg via ORAL
  Filled 2018-02-26 (×2): qty 2

## 2018-02-26 MED ORDER — KETOROLAC TROMETHAMINE 30 MG/ML IJ SOLN
30.0000 mg | Freq: Four times a day (QID) | INTRAMUSCULAR | Status: DC
  Administered 2018-02-26 – 2018-02-28 (×7): 30 mg via INTRAVENOUS
  Filled 2018-02-26 (×13): qty 1

## 2018-02-26 MED ORDER — ROCURONIUM BROMIDE 50 MG/5ML IV SOLN
INTRAVENOUS | Status: AC
  Filled 2018-02-26: qty 1

## 2018-02-26 MED ORDER — BUPIVACAINE HCL (PF) 0.5 % IJ SOLN
INTRAMUSCULAR | Status: AC
  Filled 2018-02-26: qty 30

## 2018-02-26 MED ORDER — MORPHINE SULFATE (PF) 4 MG/ML IV SOLN
4.0000 mg | Freq: Once | INTRAVENOUS | Status: AC
  Administered 2018-02-26: 4 mg via INTRAVENOUS
  Filled 2018-02-26: qty 1

## 2018-02-26 MED ORDER — IOPAMIDOL (ISOVUE-300) INJECTION 61%
100.0000 mL | Freq: Once | INTRAVENOUS | Status: AC | PRN
  Administered 2018-02-26: 100 mL via INTRAVENOUS

## 2018-02-26 MED ORDER — LIDOCAINE HCL (PF) 2 % IJ SOLN
INTRAMUSCULAR | Status: AC
  Filled 2018-02-26: qty 10

## 2018-02-26 MED ORDER — PIPERACILLIN-TAZOBACTAM 3.375 G IVPB
3.3750 g | Freq: Three times a day (TID) | INTRAVENOUS | Status: DC
  Administered 2018-02-26 – 2018-02-28 (×5): 3.375 g via INTRAVENOUS
  Filled 2018-02-26 (×5): qty 50

## 2018-02-26 MED ORDER — MIDAZOLAM HCL 2 MG/2ML IJ SOLN
INTRAMUSCULAR | Status: DC | PRN
  Administered 2018-02-26: 2 mg via INTRAVENOUS

## 2018-02-26 MED ORDER — HYDRALAZINE HCL 20 MG/ML IJ SOLN: 10.0000 mg | INTRAMUSCULAR | Status: DC | PRN

## 2018-02-26 MED ORDER — SUCCINYLCHOLINE CHLORIDE 20 MG/ML IJ SOLN
INTRAMUSCULAR | Status: DC | PRN
  Administered 2018-02-26: 80 mg via INTRAVENOUS

## 2018-02-26 MED ORDER — DEXAMETHASONE SODIUM PHOSPHATE 10 MG/ML IJ SOLN
INTRAMUSCULAR | Status: AC
  Filled 2018-02-26: qty 1

## 2018-02-26 MED ORDER — MIDAZOLAM HCL 2 MG/2ML IJ SOLN
INTRAMUSCULAR | Status: AC
  Filled 2018-02-26: qty 2

## 2018-02-26 MED ORDER — PIPERACILLIN-TAZOBACTAM 3.375 G IVPB 30 MIN
3.3750 g | Freq: Once | INTRAVENOUS | Status: AC
  Administered 2018-02-26: 3.375 g via INTRAVENOUS
  Filled 2018-02-26: qty 50

## 2018-02-26 MED ORDER — SUCCINYLCHOLINE CHLORIDE 20 MG/ML IJ SOLN
INTRAMUSCULAR | Status: AC
  Filled 2018-02-26: qty 1

## 2018-02-26 MED ORDER — SODIUM CHLORIDE 0.9 % IV BOLUS (SEPSIS)
1000.0000 mL | Freq: Once | INTRAVENOUS | Status: AC
  Administered 2018-02-26: 1000 mL via INTRAVENOUS

## 2018-02-26 MED ORDER — DIPHENHYDRAMINE HCL 50 MG/ML IJ SOLN: 25.0000 mg | Freq: Four times a day (QID) | INTRAMUSCULAR | Status: DC | PRN

## 2018-02-26 MED ORDER — KETOROLAC TROMETHAMINE 30 MG/ML IJ SOLN
INTRAMUSCULAR | Status: AC
  Filled 2018-02-26: qty 1

## 2018-02-26 MED ORDER — DIPHENHYDRAMINE HCL 50 MG/ML IJ SOLN
INTRAMUSCULAR | Status: AC
  Filled 2018-02-26: qty 1

## 2018-02-26 MED ORDER — FENTANYL CITRATE (PF) 100 MCG/2ML IJ SOLN
INTRAMUSCULAR | Status: AC
  Filled 2018-02-26: qty 2

## 2018-02-26 MED ORDER — ONDANSETRON 4 MG PO TBDP: 4.0000 mg | ORAL_TABLET | Freq: Four times a day (QID) | ORAL | Status: DC | PRN

## 2018-02-26 MED ORDER — BUPIVACAINE-EPINEPHRINE (PF) 0.25% -1:200000 IJ SOLN
INTRAMUSCULAR | Status: AC
  Filled 2018-02-26: qty 30

## 2018-02-26 MED ORDER — ONDANSETRON HCL 4 MG/2ML IJ SOLN
INTRAMUSCULAR | Status: AC
  Filled 2018-02-26: qty 2

## 2018-02-26 MED ORDER — ROCURONIUM BROMIDE 100 MG/10ML IV SOLN
INTRAVENOUS | Status: DC | PRN
  Administered 2018-02-26: 30 mg via INTRAVENOUS

## 2018-02-26 MED ORDER — FENTANYL CITRATE (PF) 100 MCG/2ML IJ SOLN
INTRAMUSCULAR | Status: DC | PRN
  Administered 2018-02-26: 100 ug via INTRAVENOUS
  Administered 2018-02-26 (×2): 50 ug via INTRAVENOUS

## 2018-02-26 MED ORDER — PNEUMOCOCCAL VAC POLYVALENT 25 MCG/0.5ML IJ INJ
0.5000 mL | INJECTION | INTRAMUSCULAR | Status: AC
  Administered 2018-02-27: 0.5 mL via INTRAMUSCULAR
  Filled 2018-02-26: qty 0.5

## 2018-02-26 MED ORDER — SUGAMMADEX SODIUM 200 MG/2ML IV SOLN
INTRAVENOUS | Status: DC | PRN
  Administered 2018-02-26: 130 mg via INTRAVENOUS

## 2018-02-26 MED ORDER — DIPHENHYDRAMINE HCL 50 MG/ML IJ SOLN
25.0000 mg | Freq: Once | INTRAMUSCULAR | Status: AC
  Administered 2018-02-26: 25 mg via INTRAVENOUS

## 2018-02-26 MED ORDER — FENTANYL CITRATE (PF) 100 MCG/2ML IJ SOLN
INTRAMUSCULAR | Status: AC
  Administered 2018-02-26: 25 ug via INTRAVENOUS
  Filled 2018-02-26: qty 2

## 2018-02-26 MED ORDER — DIPHENHYDRAMINE HCL 25 MG PO CAPS
25.0000 mg | ORAL_CAPSULE | Freq: Four times a day (QID) | ORAL | Status: DC | PRN
  Administered 2018-02-27 (×2): 25 mg via ORAL
  Filled 2018-02-26 (×2): qty 1

## 2018-02-26 MED ORDER — DEXAMETHASONE SODIUM PHOSPHATE 10 MG/ML IJ SOLN
INTRAMUSCULAR | Status: DC | PRN
  Administered 2018-02-26: 10 mg via INTRAVENOUS

## 2018-02-26 MED ORDER — LACTATED RINGERS IV SOLN
INTRAVENOUS | Status: DC
  Administered 2018-02-26 – 2018-02-27 (×4): via INTRAVENOUS

## 2018-02-26 MED ORDER — ACETAMINOPHEN 500 MG PO TABS
1000.0000 mg | ORAL_TABLET | Freq: Four times a day (QID) | ORAL | Status: DC
  Administered 2018-02-26 – 2018-02-28 (×6): 1000 mg via ORAL
  Filled 2018-02-26 (×7): qty 2

## 2018-02-26 MED ORDER — MORPHINE SULFATE (PF) 4 MG/ML IV SOLN
4.0000 mg | INTRAVENOUS | Status: DC | PRN
  Administered 2018-02-27: 4 mg via INTRAVENOUS
  Filled 2018-02-26: qty 1

## 2018-02-26 SURGICAL SUPPLY — 64 items
APPLIER CLIP 5 13 M/L LIGAMAX5 (MISCELLANEOUS)
BLADE CLIPPER SURG (BLADE) IMPLANT
BLADE SURG SZ11 CARB STEEL (BLADE) ×3 IMPLANT
CANISTER SUCT 1200ML W/VALVE (MISCELLANEOUS) ×3 IMPLANT
CATH ROBINSON RED A/P 16FR (CATHETERS) ×3 IMPLANT
CHLORAPREP W/TINT 26ML (MISCELLANEOUS) ×3 IMPLANT
CLIP APPLIE 5 13 M/L LIGAMAX5 (MISCELLANEOUS) IMPLANT
CUTTER FLEX LINEAR 45M (STAPLE) ×9 IMPLANT
DERMABOND ADVANCED (GAUZE/BANDAGES/DRESSINGS) ×1
DERMABOND ADVANCED .7 DNX12 (GAUZE/BANDAGES/DRESSINGS) ×2 IMPLANT
DRAPE LEGGINS SURG 28X43 STRL (DRAPES) ×3 IMPLANT
DRAPE UNDER BUTTOCK W/FLU (DRAPES) ×3 IMPLANT
DRESSING SURGICEL FIBRLLR 1X2 (HEMOSTASIS) ×2 IMPLANT
DRSG SURGICEL FIBRILLAR 1X2 (HEMOSTASIS) ×3
DRSG TELFA 4X3 1S NADH ST (GAUZE/BANDAGES/DRESSINGS) ×3 IMPLANT
ELECT CAUTERY BLADE 6.4 (BLADE) ×3 IMPLANT
ELECT REM PT RETURN 9FT ADLT (ELECTROSURGICAL) ×3
ELECTRODE REM PT RTRN 9FT ADLT (ELECTROSURGICAL) ×2 IMPLANT
GLOVE BIO SURGEON STRL SZ7 (GLOVE) IMPLANT
GLOVE BIO SURGEON STRL SZ8 (GLOVE) ×12 IMPLANT
GLOVE INDICATOR 8.0 STRL GRN (GLOVE) ×3 IMPLANT
GOWN STRL REUS W/ TWL LRG LVL3 (GOWN DISPOSABLE) ×6 IMPLANT
GOWN STRL REUS W/ TWL XL LVL3 (GOWN DISPOSABLE) IMPLANT
GOWN STRL REUS W/TWL LRG LVL3 (GOWN DISPOSABLE) ×3
GOWN STRL REUS W/TWL XL LVL3 (GOWN DISPOSABLE)
GRASPER SUT TROCAR 14GX15 (MISCELLANEOUS) IMPLANT
IRRIGATION STRYKERFLOW (MISCELLANEOUS) ×2 IMPLANT
IRRIGATOR STRYKERFLOW (MISCELLANEOUS) ×3
IV LACTATED RINGERS 1000ML (IV SOLUTION) IMPLANT
IV NS 1000ML (IV SOLUTION) ×1
IV NS 1000ML BAXH (IV SOLUTION) ×2 IMPLANT
KIT PINK PAD W/HEAD ARE REST (MISCELLANEOUS) ×3
KIT PINK PAD W/HEAD ARM REST (MISCELLANEOUS) ×2 IMPLANT
LABEL OR SOLS (LABEL) IMPLANT
NEEDLE HYPO 22GX1.5 SAFETY (NEEDLE) ×3 IMPLANT
NEEDLE VERESS 14GA 120MM (NEEDLE) ×3 IMPLANT
NS IRRIG 500ML POUR BTL (IV SOLUTION) ×3 IMPLANT
PACK GYN LAPAROSCOPIC (MISCELLANEOUS) ×3 IMPLANT
PACK LAP CHOLECYSTECTOMY (MISCELLANEOUS) ×3 IMPLANT
PAD PREP 24X41 OB/GYN DISP (PERSONAL CARE ITEMS) ×3 IMPLANT
PENCIL ELECTRO HAND CTR (MISCELLANEOUS) IMPLANT
POUCH SPECIMEN RETRIEVAL 10MM (ENDOMECHANICALS) ×3 IMPLANT
RELOAD 45 VASCULAR/THIN (ENDOMECHANICALS) IMPLANT
RELOAD STAPLE TA45 3.5 REG BLU (ENDOMECHANICALS) ×3 IMPLANT
SCALPEL HARMONIC ACE (MISCELLANEOUS) ×3 IMPLANT
SCISSORS METZENBAUM CVD 33 (INSTRUMENTS) ×6 IMPLANT
SHEARS HARMONIC ACE PLUS 36CM (ENDOMECHANICALS) ×3 IMPLANT
SLEEVE ENDOPATH XCEL 5M (ENDOMECHANICALS) ×3 IMPLANT
SPONGE GAUZE 2X2 8PLY STRL LF (GAUZE/BANDAGES/DRESSINGS) IMPLANT
SPONGE LAP 18X18 5 PK (GAUZE/BANDAGES/DRESSINGS) ×3 IMPLANT
STRAP SAFETY 5IN WIDE (MISCELLANEOUS) ×3 IMPLANT
SUT MNCRL AB 4-0 PS2 18 (SUTURE) ×3 IMPLANT
SUT VIC AB 0 CT1 36 (SUTURE) ×3 IMPLANT
SUT VIC AB 2-0 UR6 27 (SUTURE) IMPLANT
SUT VIC AB 4-0 PS2 18 (SUTURE) IMPLANT
SUT VICRYL 0 AB UR-6 (SUTURE) ×6 IMPLANT
SYR 10ML LL (SYRINGE) ×3 IMPLANT
SYR 20CC LL (SYRINGE) ×3 IMPLANT
TRAY FOLEY W/METER SILVER 16FR (SET/KITS/TRAYS/PACK) IMPLANT
TROCAR ENDO BLADELESS 11MM (ENDOMECHANICALS) IMPLANT
TROCAR XCEL BLUNT TIP 100MML (ENDOMECHANICALS) ×3 IMPLANT
TROCAR XCEL NON-BLD 5MMX100MML (ENDOMECHANICALS) ×3 IMPLANT
TUBING INSUF HEATED (TUBING) IMPLANT
TUBING INSUFFLATION (TUBING) ×3 IMPLANT

## 2018-02-26 NOTE — Transfer of Care (Signed)
Immediate Anesthesia Transfer of Care Note  Patient: Kimberly Davidson  Procedure(s) Performed: APPENDECTOMY LAPAROSCOPIC (N/A ) LAPAROSCOPIC OVARIAN CYSTECTOMY (Left )  Patient Location: PACU  Anesthesia Type:General  Level of Consciousness: awake, alert , oriented and patient cooperative  Airway & Oxygen Therapy: Patient Spontanous Breathing  Post-op Assessment: Report given to RN and Post -op Vital signs reviewed and stable  Post vital signs: Reviewed and stable  Last Vitals:  Vitals:   02/26/18 1927 02/26/18 2153  BP: 117/70 (!) 134/91  Pulse: 74 (!) 110  Resp: 20 18  Temp: 37.3 C (!) 36.1 C  SpO2: 100% 100%    Last Pain:  Vitals:   02/26/18 1927  TempSrc: Oral  PainSc:          Complications: No apparent anesthesia complications

## 2018-02-26 NOTE — Progress Notes (Signed)
The risks, benefits, complications, treatment options, and expected outcomes were discussed with the patient. Also discussed continuing to the operating room for Laparoscopic Appendectomy.  The possibilities of  bleeding, recurrent infection, perforation of viscus, finding a normal appendix, the need for additional procedures, failure to diagnose a condition, conversion to open procedure and creating a complication requiring transfusion or further operations were discussed. The patient was given the opportunity to ask questions and have them answered.  Patient would like to proceed with Laparoscopic Appendectomy and consent was obtained.

## 2018-02-26 NOTE — Anesthesia Procedure Notes (Signed)
Procedure Name: Intubation Date/Time: 02/26/2018 8:46 PM Performed by: Lendon Colonel, CRNA Pre-anesthesia Checklist: Patient identified, Patient being monitored, Timeout performed, Emergency Drugs available and Suction available Patient Re-evaluated:Patient Re-evaluated prior to induction Oxygen Delivery Method: Circle system utilized Preoxygenation: Pre-oxygenation with 100% oxygen Induction Type: IV induction, Rapid sequence and Cricoid Pressure applied Laryngoscope Size: Miller and 2 Grade View: Grade I Tube type: Oral Tube size: 7.0 mm Number of attempts: 1 Airway Equipment and Method: Stylet Placement Confirmation: ETT inserted through vocal cords under direct vision,  positive ETCO2 and breath sounds checked- equal and bilateral Secured at: 20 cm Tube secured with: Tape Dental Injury: Teeth and Oropharynx as per pre-operative assessment

## 2018-02-26 NOTE — Op Note (Signed)
laparascopic appendectomy   Kimberly Davidson Date of operation:  02/26/2018  Indications: The patient presented with a history of  abdominal pain. Workup has revealed findings consistent with acute appendicitis.  Pre-operative Diagnosis: Acute appendicitis without mention of peritonitis  Post-operative Diagnosis: Same  Surgeon: Caroleen Hamman, MD, FACS, Co-Surgeon: Dr. Star Age  Anesthesia: General with endotracheal tube  Findings: non perforated appendicitis Left hemorrhagic ovarian cyst  Estimated Blood Loss: 20cc         Specimens: appendix         Complications:  none Procedure Details  The patient was seen again in the preop area. The options of surgery versus observation were reviewed with the patient and/or family. The risks of bleeding, infection, recurrence of symptoms, negative laparoscopy, potential for an open procedure, bowel injury, abscess or infection, were all reviewed as well. The patient was taken to Operating Room, identified as Kimberly Davidson and the procedure verified as laparoscopic appendectomy. A Time Out was held and the above information confirmed.  The patient was placed in the supine position and general anesthesia was induced.  Antibiotic prophylaxis was administered and VT E prophylaxis was in place.   The abdomen was prepped and draped in a sterile fashion. An infraumbilical incision was made. A cutdown technique was used to enter the abdominal cavity. Two vicryl stitches were placed on the fascia and a Hasson trocar inserted. Pneumoperitoneum obtained. Two 5 mm ports were placed under direct visualization.   The appendix was identified and found to be acutely inflamed  The appendix was carefully dissected. The mesoappendix was divided withHarmonic scalpel. The base of the appendix was dissected out and divided with a standard load Endo GIA.The appendix was placed in a Endo Catch bag and removed via the Hasson port. The right lower quadrant and pelvis was  then irrigated with  normal saline which was aspirated. Inspection  failed to identify any additional bleeding and there were no signs of bowel injury.  Attention was turned to the LLQ  where a left ovarian cyst ws visualized. Dr. Georgianne Fick will dictate the portion of the aspiration of hemorrhagic ovarian cyst.    Again the right lower quadrant was inspected there was no sign of bleeding or bowel injury therefore pneumoperitoneum was released, all ports were removed.  The umbilical fascia was closed with 0 Vicryl interrupted sutures and the skin incisions were approximated with subcuticular 4-0 Monocryl. Dermabond was placed The patient tolerated the procedure well, there were no complications. The sponge lap and needle count were correct at the end of the procedure.  The patient was taken to the recovery room in stable condition to be admitted for continued care.    Caroleen Hamman, MD FACS
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
## 2018-02-26 NOTE — Anesthesia Post-op Follow-up Note (Signed)
Anesthesia QCDR form completed.        

## 2018-02-26 NOTE — Op Note (Signed)
Preoperative Diagnosis: 1) 22 y.o.  G0 with acute appendicitis 2) Left hemorrhagic ovarian cyst  Postoperative Diagnosis: 1) 22 y.o.  G0 with acute appendicitis 2) Left hemorrhagic ovarian cyst  Operation Performed: Laparoscopic right ovarian cystectomy (appendectomy separate operative note by Dr. Dahlia Byes)  Indication: 22 y.o. G0P0000  with known 4cm left ovarian cyst presenting to the emergency department with acute appendicitis  Surgeon: Malachy Mood, MD  Co-surgeon: Caroleen Hamman, MD  Anesthesia: Choice  Preoperative Antibiotics: none  Drains or Tubes: none  Implants: none  Specimens Removed: none  Complications: none  Intraoperative Findings: Normal tubes, right ovary, and uterus.  Enlarged left ovary, incision of the capsule revealed blood consistent with hemorrhagic cyst.  No evidence of endometriosis in the pelvis.  Patient Condition: stable  Procedure in Detail:  Patient was taken to the operating room where she was administered general anesthesia.  She was positioned in the dorsal lithotomy position utilizing Allen stirups, prepped and draped in the usual sterile fashion.  Prior to proceeding with procedure a time out was performed.  Attention was turned to the patient's pelvis.  A red rubber catheter was used to empty the patient's bladder.  An operative speculum was placed to allow visualization of the cervix.  The anterior lip of the cervix was grasped with a single tooth tenaculum, and a Hulka tenaculum was placed to allow manipulation of the uterus.  The operative speculum and single tooth tenaculum were then removed.  At this point Dr. Perrin Maltese started with the abdominal portion of the case and the appendectomy.  Following the appendectomy attention was turned to the pelvis.  General inspection of the pelvis revealed the above noted findings. The left ovary was elevated out of the pelvis, and the cortex incised using the 12mm harmonic.  Frankly bloody fluid was evacuated  with the suction irrigator.  A small portion of the cyst wall was biopsied.  The gross appearance was consistent with a benign hemorrhagic ovarian cyst.  The cyst cavity was packed with fibrillar for hemostasis.    Pneumoperitoneum was evacuated.  The trocars were removed and Dr. Dahlia Byes closed the trocar sites.  The Hulka tenaculum was removed.   Sponge needle and instrument counts were correct time two.  The patient tolerated the procedure well and was taken to the recovery room in stable condition.

## 2018-02-26 NOTE — ED Triage Notes (Signed)
Pt comes into the ED via POV c/o RLQ abdominal pain from her ovarian cyst.  Cyst was diagnosed in December and she went to her OB this week where they informed her that it is now the size of a gold ball and needs to be removed.  Patient has yet to get the call from the OB in regards to scheduling her removal appt.  Patient explains the pain is RLQ but the cyst is on the left.  Patient in NAD at this time with even and unlabored respirations. Patient does have nausea associated with the symptoms.

## 2018-02-26 NOTE — ED Provider Notes (Signed)
Patient was signed out to me by Dr. Jimmye Raekwon Winkowski.  She has bought being followed by gynecology for left ovarian cyst and is scheduled for surgery next week.  However, she developed right lower quadrant pain.  Her CT scan is positive for appendicitis and I have spoken with Dr. Adonis Huguenin.  The patient has been made n.p.o. and is receiving intravenous fluids.  Her pain is fairly well-controlled although not completely resolved.  A dose of Zosyn is being administered at this time.  I have discussed the patient's findings and plan with both patient and her mother who understand.   Eula Listen, MD 02/26/18 [phone]
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
## 2018-02-26 NOTE — Anesthesia Preprocedure Evaluation (Signed)
Anesthesia Evaluation  Patient identified by MRN, date of birth, ID band Patient awake    Reviewed: Allergy & Precautions, NPO status , Patient's Chart, lab work & pertinent test results, reviewed documented beta blocker date and time   Airway Mallampati: II  TM Distance: >3 FB     Dental  (+) Chipped   Pulmonary asthma ,           Cardiovascular      Neuro/Psych  Headaches, PSYCHIATRIC DISORDERS Anxiety Depression    GI/Hepatic   Endo/Other    Renal/GU      Musculoskeletal   Abdominal   Peds  Hematology  (+) anemia ,   Anesthesia Other Findings   Reproductive/Obstetrics                             Anesthesia Physical Anesthesia Plan  ASA: II  Anesthesia Plan: General   Post-op Pain Management:    Induction: Intravenous, Rapid sequence and Cricoid pressure planned  PONV Risk Score and Plan:   Airway Management Planned: Oral ETT  Additional Equipment:   Intra-op Plan:   Post-operative Plan:   Informed Consent: I have reviewed the patients History and Physical, chart, labs and discussed the procedure including the risks, benefits and alternatives for the proposed anesthesia with the patient or authorized representative who has indicated his/her understanding and acceptance.     Plan Discussed with: CRNA  Anesthesia Plan Comments:         Anesthesia Quick Evaluation

## 2018-02-26 NOTE — ED Notes (Signed)
Pt taken to [place], report called. VSS, NAD.

## 2018-02-27 ENCOUNTER — Encounter: Payer: Self-pay | Admitting: Surgery

## 2018-02-27 MED ORDER — OXYCODONE HCL 5 MG PO TABS
5.0000 mg | ORAL_TABLET | ORAL | Status: DC | PRN
  Administered 2018-02-27: 10 mg via ORAL
  Filled 2018-02-27: qty 2

## 2018-02-27 MED ORDER — HYDROMORPHONE HCL 1 MG/ML IJ SOLN
0.5000 mg | INTRAMUSCULAR | Status: DC | PRN
  Administered 2018-02-27 (×3): 0.5 mg via INTRAVENOUS
  Filled 2018-02-27 (×3): qty 0.5

## 2018-02-27 MED ORDER — OXYCODONE HCL 5 MG PO TABS: 5.0000 mg | ORAL_TABLET | ORAL | 0 refills | Status: DC | PRN

## 2018-02-27 MED ORDER — ZOLPIDEM TARTRATE 5 MG PO TABS
5.0000 mg | ORAL_TABLET | Freq: Once | ORAL | Status: AC
  Administered 2018-02-27: 5 mg via ORAL
  Filled 2018-02-27: qty 1

## 2018-02-27 MED ORDER — IBUPROFEN 600 MG PO TABS: 600.0000 mg | ORAL_TABLET | Freq: Three times a day (TID) | ORAL | 0 refills | Status: DC | PRN

## 2018-02-27 MED ORDER — INFLUENZA VAC SPLIT QUAD 0.5 ML IM SUSY
0.5000 mL | PREFILLED_SYRINGE | INTRAMUSCULAR | Status: AC
  Administered 2018-02-28: 0.5 mL via INTRAMUSCULAR
  Filled 2018-02-27: qty 0.5

## 2018-02-27 NOTE — Progress Notes (Signed)
 Subjective: Patient reports  incisional pain, tolerating PO and no problems voiding.  Pain is well-controlled on current  analgesic regimen, but still some limited mobility.  Tolerating po: Yes   Objective: Vital signs in last 24 hours: Temp:  [97 F (36.1 C)-99.1 F (37.3 C)] 98.1 F (36.7 C) (03/21 1220) Pulse Rate:  [72-110] 82 (03/21 1220) Resp:  [15-20] 16 (03/21 1220) BP: (103-134)/(54-93) 117/70 (03/21 1220) SpO2:  [97 %-100 %] 100 % (03/21 1220) Weight:  [143 lb (64.9 kg)] 143 lb (64.9 kg) (03/20 1315) Last BM Date: 02/26/18  Intake/Output from previous day: 03/20 0701 - 03/21 0700 In: 1863 [I.V.:1825; IV Piggyback:38] Out: 735 [Urine:720; Blood:15]  Physical Examination: Physical Exam  Constitutional: She appears well-developed and well-nourished. No distress.  HENT:  Head: Normocephalic.  Cardiovascular: Normal rate.  Pulmonary/Chest: No respiratory distress.  Abdominal: Soft. Bowel sounds are normal. She exhibits no distension. There is tenderness. There is no guarding.  Skin: She is not diaphoretic.  Trocar sites D/C/I  Labs: Results for orders placed or performed during the hospital encounter of 02/26/18 (from the past 72 hour(s))  CBC with Differential     Status: Abnormal   Collection Time: 02/26/18  1:19 PM  Result Value Ref Range   WBC 18.7 (H) 3.6 - 11.0 K/uL   RBC 4.97 3.80 - 5.20 MIL/uL   Hemoglobin 14.2 12.0 - 16.0 g/dL   HCT 43.3 35.0 - 47.0 %   MCV 87.1 80.0 - 100.0 fL   MCH 28.5 26.0 - 34.0 pg   MCHC 32.7 32.0 - 36.0 g/dL   RDW 13.9 11.5 - 14.5 %   Platelets 355 150 - 440 K/uL   Neutrophils Relative % 87 %   Neutro Abs 16.1 (H) 1.4 - 6.5 K/uL   Lymphocytes Relative 10 %   Lymphs Abs 1.9 1.0 - 3.6 K/uL   Monocytes Relative 3 %   Monocytes Absolute 0.6 0.2 - 0.9 K/uL   Eosinophils Relative 0 %   Eosinophils Absolute 0.1 0 - 0.7 K/uL   Basophils Relative 0 %   Basophils Absolute 0.0 0 - 0.1 K/uL    Comment: Performed at [facility], [address] [place], [place] [address]  Comprehensive metabolic panel     Status: None   Collection Time: 02/26/18  1:19 PM  Result Value Ref Range   Sodium 139 135 - 145 mmol/L   Potassium 3.9 3.5 - 5.1 mmol/L   Chloride 105 101 - 111 mmol/L   CO2 23 22 - 32 mmol/L   Glucose, Bld 97 65 - 99 mg/dL   BUN 9 6 - 20 mg/dL   Creatinine, Ser 0.72 0.44 - 1.00 mg/dL   Calcium 9.7 8.9 - 10.3 mg/dL   Total Protein 8.0 6.5 - 8.1 g/dL   Albumin 4.7 3.5 - 5.0 g/dL   AST 33 15 - 41 U/L   ALT 27 14 - 54 U/L   Alkaline Phosphatase 81 38 - 126 U/L   Total Bilirubin 0.6 0.3 - 1.2 mg/dL   GFR calc non Af Amer >60 >60 mL/min   GFR calc Af Amer >60 >60 mL/min    Comment: (NOTE) The eGFR has been calculated using the CKD EPI equation. This calculation has not been validated in all clinical situations. eGFR's persistently <60 mL/min signify possible Chronic Kidney Disease.    Anion gap 11 5 - 15    Comment: Performed at [facility], [address] ,  [address]    Lipase, blood     Status: None   Collection Time: 02/26/18  1:19 PM  Result Value Ref Range   Lipase 24 11 - 51 U/L    Comment: Performed at [facility], [address] [place], [place] [address]  hCG, quantitative, pregnancy     Status: None   Collection Time: 02/26/18  1:19 PM  Result Value Ref Range   hCG, Beta Chain, Quant, S <1 <5 mIU/mL    Comment:          GEST. AGE      CONC.  (mIU/mL)   <=1 WEEK        5 - 50     2 WEEKS       50 - 500     3 WEEKS       100 - 10,000     4 WEEKS     1,000 - 30,000     5 WEEKS     3,500 - 115,000   6-8 WEEKS     12,000 - 270,000    12 WEEKS     15,000 - 220,000        FEMALE AND NON-PREGNANT FEMALE:     LESS THAN 5 mIU/mL Performed at [facility], [address] [place], [place] [address]   Urinalysis, Complete w Microscopic     Status: Abnormal   Collection Time: 02/26/18  4:01 PM  Result Value Ref Range   Color, Urine  YELLOW (A) YELLOW   APPearance CLEAR (A) CLEAR   Specific Gravity, Urine 1.038 (H) 1.005 - 1.030   pH 7.0 5.0 - 8.0   Glucose, UA NEGATIVE NEGATIVE mg/dL   Hgb urine dipstick NEGATIVE NEGATIVE   Bilirubin Urine NEGATIVE NEGATIVE   Ketones, ur 20 (A) NEGATIVE mg/dL   Protein, ur NEGATIVE NEGATIVE mg/dL   Nitrite NEGATIVE NEGATIVE   Leukocytes, UA NEGATIVE NEGATIVE   RBC / HPF 0-5 0 - 5 RBC/hpf   WBC, UA 0-5 0 - 5 WBC/hpf   Bacteria, UA NONE SEEN NONE SEEN   Squamous Epithelial / LPF 6-30 (A) NONE SEEN   Mucus PRESENT     Comment: Performed at [facility], [address] [place], [place] [address]  Protime-INR     Status: None   Collection Time: 02/26/18  5:19 PM  Result Value Ref Range   Prothrombin Time 13.8 11.4 - 15.2 seconds   INR 1.07     Comment: Performed at [facility], [address] [place], [place] [address]  APTT     Status: None   Collection Time: 02/26/18  5:19 PM  Result Value Ref Range   aPTT 30 24 - 36 seconds    Comment: Performed at [facility], [address] [place], [place] [address]     Assessment:  22 y.o. s/p 1 Day Post-Op Procedure(s) (LRB): APPENDECTOMY LAPAROSCOPIC (N/A) LAPAROSCOPIC OVARIAN CYSTECTOMY (Left) : stable  Plan: 1) Pain: continue current analgesics regimen  2) Heme: minimal intraoperative blood loss, has been afebrile and hemodynamically stable  3) FEN: Advance diet per general surgery  4) Prophylaxis: scd.  5) Disposition: The patient is to be discharged to home once cleared by general surgery.  Arrange follow up at [facility] in the next 1 week  Malachy Mood, MD, [place], [facility] 02/27/2018, 12:52 PM

## 2018-02-27 NOTE — Progress Notes (Signed)
02/27/2018 4:46 PM  Informed pt Dr. Hampton Abbot plans to discharge her today.  Pt expressed reluctance due to pain with ambulation and activity.  Spoke with Dr. Hampton Abbot who says this was okay and will plan for pt to discharge home tomorrow.  Notified pt of this decision.  [name], RN

## 2018-02-27 NOTE — Anesthesia Postprocedure Evaluation (Signed)
Anesthesia Post Note  Patient: Kimberly Davidson  Procedure(Davidson) Performed: APPENDECTOMY LAPAROSCOPIC (N/A ) LAPAROSCOPIC OVARIAN CYSTECTOMY (Left )  Patient location during evaluation: PACU Anesthesia Type: General Level of consciousness: awake and alert Pain management: pain level controlled Vital Signs Assessment: post-procedure vital signs reviewed and stable Respiratory status: spontaneous breathing, nonlabored ventilation, respiratory function stable and patient connected to nasal cannula oxygen Cardiovascular status: blood pressure returned to baseline and stable Postop Assessment: no apparent nausea or vomiting Anesthetic complications: no     Last Vitals:  Vitals:   02/26/18 2250 02/26/18 2328  BP: 115/75 115/77  Pulse: 82 93  Resp: 16 20  Temp: 36.9 C 36.8 C  SpO2: 100% 100%    Last Pain:  Vitals:   02/26/18 2328  TempSrc: Oral  PainSc:                  Kimberly Davidson

## 2018-02-27 NOTE — Progress Notes (Signed)
02/27/2018  Subjective: Patient is 1 Day Post-Op s/p laparoscopic appendectomy.  No acute events.  Patient having some soreness in right lower quadrant and incisional pain.  Vital signs: Temp:  [97 F (36.1 C)-99.1 F (37.3 C)] 98.1 F (36.7 C) (03/21 0440) Pulse Rate:  [72-110] 72 (03/21 0440) Resp:  [15-20] 19 (03/21 0440) BP: (103-134)/(54-93) 103/54 (03/21 0440) SpO2:  [97 %-100 %] 99 % (03/21 0440) Weight:  [64.9 kg (143 lb)] 64.9 kg (143 lb) (03/20 1315)   Intake/Output: 03/20 0701 - 03/21 0700 In: 1863 [I.V.:1825; IV Piggyback:38] Out: 662 [Urine:720; Blood:15] Last BM Date: 02/26/18  Physical Exam: Constitutional: No acute distress Abdomen:  Soft, nondistended, appropriately tender to palpation.  Incisions clean, dry, intact with no evidence of infection.  Labs:  Recent Labs    02/26/18 1319  WBC 18.7*  HGB 14.2  HCT 43.3  PLT 355   Recent Labs    02/26/18 1319  NA 139  K 3.9  CL 105  CO2 23  GLUCOSE 97  BUN 9  CREATININE 0.72  CALCIUM 9.7   Recent Labs    02/26/18 1719  LABPROT 13.8  INR 1.07    Imaging: Ct Abdomen Pelvis W Contrast  Result Date: 02/26/2018 CLINICAL DATA:  Right lower quadrant abdominal pain. Known left ovarian cyst. EXAM: CT ABDOMEN AND PELVIS WITH CONTRAST TECHNIQUE: Multidetector CT imaging of the abdomen and pelvis was performed using the standard protocol following bolus administration of intravenous contrast. CONTRAST:  141mL ISOVUE-300 IOPAMIDOL (ISOVUE-300) INJECTION 61% COMPARISON:  Outside pelvic ultrasound from yesterday. CT abdomen and pelvis dated February 15, 2015. FINDINGS: Lower chest: No acute abnormality. Hepatobiliary: No focal liver abnormality is seen. No gallstones, gallbladder wall thickening, or biliary dilatation. Pancreas: Unremarkable. No pancreatic ductal dilatation or surrounding inflammatory changes. Spleen: Normal in size without focal abnormality. Adrenals/Urinary Tract: Adrenal glands are unremarkable.  Kidneys are normal, without renal calculi, focal lesion, or hydronephrosis. Bladder is unremarkable. Stomach/Bowel: There is evidence of acute appendicitis. Appendix: Location: Right lower quadrant, just medial to the cecum. Diameter: 9 mm. Appendicolith: None. Mucosal hyper-enhancement: Yes. Extraluminal gas: None. Periappendiceal collection: None. Stomach is within normal limits. No evidence of bowel wall thickening, distention, or inflammatory changes. Vascular/Lymphatic: No significant vascular findings are present. No enlarged abdominal or pelvic lymph nodes. Reproductive: The uterus is unremarkable. Dominant follicle in the right ovary. There is a 4.7 cm cystic lesion containing intermediate density within the left ovary, most consistent with a hemorrhagic cyst. Other: Small amount of slightly hyperdense fluid in the pelvis. No pneumoperitoneum. Musculoskeletal: No acute or significant osseous findings. IMPRESSION: 1. Uncomplicated acute appendicitis. 2. 4.7 cm hemorrhagic cyst within the left ovary. Small amount of adjacent hyperdense fluid in the pelvis could reflect rupture. Electronically Signed   By: Titus Dubin M.D.   On: 02/26/2018 16:00    Assessment/Plan: 22 yo female s/p laparoscopic appendectomy  --will advance diet today --pain control with po and iv medications --possible discharge to home this afternoon.   Melvyn Neth, [place]

## 2018-02-28 ENCOUNTER — Other Ambulatory Visit: Payer: Medicaid Other

## 2018-02-28 ENCOUNTER — Encounter: Payer: Self-pay | Admitting: Obstetrics and Gynecology

## 2018-02-28 LAB — SURGICAL PATHOLOGY

## 2018-02-28 NOTE — Discharge Instructions (Signed)
In addition to included general post-operative instructions for Laparoscopic Appendectomy,  Diet: Resume home heart healthy diet.   Activity: No heavy lifting >20 pounds (children, pets, laundry, garbage) or strenuous activity until follow-up, but light activity and walking are encouraged. Do not drive or drink alcohol if taking narcotic pain medications.  Wound care: 2 days after surgery (Friday, 3/22), may shower/get incision wet with soapy water and pat dry (do not rub incisions), but no baths or submerging incision underwater until follow-up.   Medications: Resume all home medications. For mild to moderate pain: acetaminophen (Tylenol) or ibuprofen/naproxen (if no kidney disease). Combining Tylenol with alcohol can substantially increase your risk of causing liver disease. Narcotic pain medications, if prescribed, can be used for severe pain, though may cause nausea, constipation, and drowsiness. Do not combine Tylenol and Percocet (or similar) within a 6 hour period as Percocet (and similar) contain(s) Tylenol. If you do not need the narcotic pain medication, you do not need to fill the prescription.  Call office [phone]) at any time if any questions, worsening pain, fevers/chills, bleeding, drainage from incision site, or other concerns.

## 2018-02-28 NOTE — Progress Notes (Signed)
02/28/2018 12:26 PM  Kimberly Davidson to be D/C'd Home per MD order.  Discussed prescriptions and follow up appointments with the patient. Prescriptions given to patient, medication list explained in detail. Pt verbalized understanding.  Allergies as of 02/28/2018   No Known Allergies     Medication List    STOP taking these medications   naproxen 500 MG tablet Commonly known as:  NAPROSYN     TAKE these medications   cetirizine 10 MG tablet Commonly known as:  ZYRTEC TAKE ONE TABLET BY MOUTH EVERY DAY   DULoxetine 60 MG capsule Commonly known as:  CYMBALTA Take 1 capsule (60 mg total) by mouth daily. Start with one daily for one week after that take 2 pills daily   ferrous sulfate 325 (65 FE) MG EC tablet Take 1 tablet (325 mg total) by mouth 3 (three) times daily with meals.   fluticasone 50 MCG/ACT nasal spray Commonly known as:  FLONASE INHALE 2 SPRAYS INTO BOTH NOSTRILS ONCE DAILY   ibuprofen 600 MG tablet Commonly known as:  ADVIL,MOTRIN Take 1 tablet (600 mg total) by mouth every 8 (eight) hours as needed for fever, headache, mild pain or moderate pain.   metroNIDAZOLE 0.75 % vaginal gel Commonly known as:  METROGEL Place 1 Applicatorful vaginally at bedtime for 5 days.   metroNIDAZOLE 500 MG tablet Commonly known as:  FLAGYL Take 1 tablet (500 mg total) by mouth 2 (two) times daily for 7 days.   ondansetron 8 MG disintegrating tablet Commonly known as:  ZOFRAN ODT Take 1 tablet (8 mg total) by mouth every 8 (eight) hours as needed for nausea or vomiting.   oxyCODONE 5 MG immediate release tablet Commonly known as:  Oxy IR/ROXICODONE Take 1-2 tablets (5-10 mg total) by mouth every 4 (four) hours as needed for moderate pain or severe pain.   pimecrolimus 1 % cream Commonly known as:  ELIDEL Apply 1 application topically daily.   PROAIR HFA 108 (90 Base) MCG/ACT inhaler Generic drug:  albuterol INHALE 2 PUFFS INTO THE LUNGS EVERY 6 HOURS AS NEEDED FOR  WHEEZING OR SOB   rizatriptan 5 MG disintegrating tablet Commonly known as:  MAXALT-MLT Take 1 tablet (5 mg total) by mouth as needed. May repeat in 2 hours if needed   topiramate 50 MG tablet Commonly known as:  TOPAMAX Take 1 tablet (50 mg total) by mouth 2 (two) times daily. What changed:    when to take this  reasons to take this   traZODone 50 MG tablet Commonly known as:  DESYREL Take 0.5-1 tablets (25-50 mg total) by mouth at bedtime as needed for sleep.   valACYclovir 1000 MG tablet Commonly known as:  VALTREX Take 2 tablets (2,000 mg total) by mouth 2 (two) times daily. On first day of outbreak only.       Vitals:   02/27/18 2254 02/28/18 0507  BP: 130/76 (!) 104/56  Pulse:  73  Resp:  16  Temp:  99.5 F (37.5 C)  SpO2:  100%    Skin clean, dry and intact without evidence of skin break down, no evidence of skin tears noted. IV catheter discontinued intact. Site without signs and symptoms of complications. Dressing and pressure applied. Pt denies pain at this time. No complaints noted.  An After Visit Summary was printed and given to the patient. Patient escorted via [place], and D/C home via private auto.  [name]

## 2018-03-03 ENCOUNTER — Telehealth: Payer: Self-pay | Admitting: Surgery

## 2018-03-03 NOTE — Telephone Encounter (Signed)
Patients calling said she needs a doctors note for court she was suppose to have tomorrow, but on the discharge paperwork states the patient can not drive due to the medications, Court is asking for a letter stating this information and can be faxed to [phone]. Patient can be reached at [phone] if any questions.

## 2018-03-03 NOTE — Telephone Encounter (Signed)
Patient called back and she stated that she needed a letter to be faxed to the court house since she is currently taking pain medications due to having surgery on 02/26/2018. I told her that I would fax it today.  Fax Number: [phone]

## 2018-03-03 NOTE — Telephone Encounter (Signed)
Called patient and had to leave her a voicemail to return my call. 

## 2018-03-03 NOTE — Discharge Summary (Signed)
Physician Discharge Summary  Patient ID: NEILA TEEM MRN: [id number] DOB/AGE: [birth year] 21 y.o.  Admit date: 02/26/2018 Discharge date: 03/03/2018  Admission Diagnoses:  Discharge Diagnoses:  Active Problems:   Appendicitis   Discharged Condition: good  Hospital Course: 22 y.o. female presented to [facility] ED for abdominal pain. Workup was found to be significant for CT imaging demonstrating acute appendicitis with possible ruptured enlarged hemorrhagic Left ovarian cyst (the latter finding confirmed on trans-vaginal ultrasound). Informed consent was obtained and documented, and patient underwent uneventful laparoscopic appendectomy ([place], 02/26/2018) with intra-operative ovarian cystectomy Georgianne Fick, 02/26/2018).  Post-operatively, patient's pain improved/resolved and advancement of patient's diet and ambulation were well-tolerated. The remainder of patient's hospital course was essentially unremarkable, and discharge planning was initiated accordingly with patient safely able to be discharged home with appropriate discharge instructions, pain control, and outpatient surgical and gyn follow-up after all of her questions were answered to her expressed satisfaction.  Consults: None and gynecology  Significant Diagnostic Studies: radiology: CT scan: acute appendicitis with possibly ruptured enlarged hemorrhagic Left ovarian cyst  Treatments: , antibiotics, and surgery: laparoscopic appendectomy with Left ovarian cystectomy  Discharge Exam: Blood pressure (!) 104/56, pulse 73, temperature 99.5 F (37.5 C), temperature source Oral, resp. rate 16, height 5\' 7"  (1.702 m), weight 143 lb (64.9 kg), SpO2 100 %. General appearance: alert, cooperative and no distress GI: abdomen soft and non-distended with mild peri-incisional tenderness to palpation, incisions well-approximated without surrounding erythema or drainage  Disposition:   Discharge Instructions    Call MD for:  difficulty breathing,  headache or visual disturbances   Complete by:  As directed    Call MD for:  persistant nausea and vomiting   Complete by:  As directed    Call MD for:  redness, tenderness, or signs of infection (pain, swelling, redness, odor or green/yellow discharge around incision site)   Complete by:  As directed    Call MD for:  severe uncontrolled pain   Complete by:  As directed    Call MD for:  temperature >100.4   Complete by:  As directed    Diet - low sodium heart healthy   Complete by:  As directed    Discharge instructions   Complete by:  As directed    1.  Patient may shower, but do not scrub wound heavily and dab dry only. 2.  Do not submerge wounds in pool/tub   Driving Restrictions   Complete by:  As directed    Do not drive while taking narcotics for pain control.   Increase activity slowly   Complete by:  As directed    Lifting restrictions   Complete by:  As directed    No heavy lifting or pushing of more than 10-15 lbs for 4 weeks.   No dressing needed   Complete by:  As directed      Allergies as of 02/28/2018   No Known Allergies     Medication List    STOP taking these medications   naproxen 500 MG tablet Commonly known as:  NAPROSYN     TAKE these medications   cetirizine 10 MG tablet Commonly known as:  ZYRTEC TAKE ONE TABLET BY MOUTH EVERY DAY   DULoxetine 60 MG capsule Commonly known as:  CYMBALTA Take 1 capsule (60 mg total) by mouth daily. Start with one daily for one week after that take 2 pills daily   ferrous sulfate 325 (65 FE) MG EC tablet Take 1 tablet (325 mg total)  by mouth 3 (three) times daily with meals.   fluticasone 50 MCG/ACT nasal spray Commonly known as:  FLONASE INHALE 2 SPRAYS INTO BOTH NOSTRILS ONCE DAILY   ibuprofen 600 MG tablet Commonly known as:  ADVIL,MOTRIN Take 1 tablet (600 mg total) by mouth every 8 (eight) hours as needed for fever, headache, mild pain or moderate pain.   metroNIDAZOLE 500 MG tablet Commonly known  as:  FLAGYL Take 1 tablet (500 mg total) by mouth 2 (two) times daily for 7 days.   ondansetron 8 MG disintegrating tablet Commonly known as:  ZOFRAN ODT Take 1 tablet (8 mg total) by mouth every 8 (eight) hours as needed for nausea or vomiting.   oxyCODONE 5 MG immediate release tablet Commonly known as:  Oxy IR/ROXICODONE Take 1-2 tablets (5-10 mg total) by mouth every 4 (four) hours as needed for moderate pain or severe pain.   pimecrolimus 1 % cream Commonly known as:  ELIDEL Apply 1 application topically daily.   PROAIR HFA 108 (90 Base) MCG/ACT inhaler Generic drug:  albuterol INHALE 2 PUFFS INTO THE LUNGS EVERY 6 HOURS AS NEEDED FOR WHEEZING OR SOB   rizatriptan 5 MG disintegrating tablet Commonly known as:  MAXALT-MLT Take 1 tablet (5 mg total) by mouth as needed. May repeat in 2 hours if needed   topiramate 50 MG tablet Commonly known as:  TOPAMAX Take 1 tablet (50 mg total) by mouth 2 (two) times daily. What changed:    when to take this  reasons to take this   traZODone 50 MG tablet Commonly known as:  DESYREL Take 0.5-1 tablets (25-50 mg total) by mouth at bedtime as needed for sleep.   valACYclovir 1000 MG tablet Commonly known as:  VALTREX Take 2 tablets (2,000 mg total) by mouth 2 (two) times daily. On first day of outbreak only.     ASK your doctor about these medications   metroNIDAZOLE 0.75 % vaginal gel Commonly known as:  METROGEL Place 1 Applicatorful vaginally at bedtime for 5 days. Ask about: Should I take this medication?      Follow-up Information    Malachy Mood, MD. Go on 03/06/2018.   Specialty:  Obstetrics and Gynecology Why:  For wound re-check, Dr. Georgianne Fick, Thursday, 3/28 at 10:30 a.m., [phone] Contact information: [place] [address] [phone]        Olean Ree, MD. Go on 03/19/2018.   Specialty:  Surgery Why:  Wednesday April 10th 10:30 Contact information: [place] [address] [phone]           Signed: Vickie Epley 03/03/2018, 11:38 PM

## 2018-03-04 ENCOUNTER — Ambulatory Visit: Admit: 2018-03-04 | Payer: Medicaid Other | Admitting: Obstetrics and Gynecology

## 2018-03-04 SURGERY — EXCISION, CYST, OVARY, LAPAROSCOPIC
Anesthesia: Choice | Laterality: Left

## 2018-03-06 ENCOUNTER — Ambulatory Visit (INDEPENDENT_AMBULATORY_CARE_PROVIDER_SITE_OTHER): Payer: BLUE CROSS/BLUE SHIELD | Admitting: Obstetrics and Gynecology

## 2018-03-06 ENCOUNTER — Encounter: Payer: Self-pay | Admitting: Obstetrics and Gynecology

## 2018-03-06 VITALS — BP 116/82 | HR 82 | Wt 135.0 lb

## 2018-03-06 DIAGNOSIS — Z09 Encounter for follow-up examination after completed treatment for conditions other than malignant neoplasm: Secondary | ICD-10-CM

## 2018-03-06 MED ORDER — PROMETHAZINE HCL 12.5 MG PO TABS: 12.5000 mg | ORAL_TABLET | Freq: Four times a day (QID) | ORAL | 0 refills | Status: DC | PRN

## 2018-03-06 MED ORDER — HYDROCODONE-ACETAMINOPHEN 5-325 MG PO TABS: 1.0000 | ORAL_TABLET | Freq: Four times a day (QID) | ORAL | 0 refills | Status: DC | PRN

## 2018-03-06 NOTE — Progress Notes (Signed)
Postoperative Follow-up Patient presents post op from laparoscopic appendectomy and left ovarian cystectomy 1weeks ago for acute appendicitis and left hemorrhagic ovarian cyst.  Subjective: Patient reports some improvement in her preop symptoms. Eating a regular diet without difficulty, still having some nausea. Pain is controlled with current analgesics. Medications being used: percocet and ibuprofen.  Activity: normal activities of daily living.  Objective: Blood pressure 116/82, pulse 82, weight 135 lb (61.2 kg), last menstrual period 02/21/2018.  Gen: NAD HEENT: normocephalic anicteric Pulmonary: no increased work of breathing Abdomen: soft, non-tender, non-distended, trocar sites D/C/I Ext: no edema  Admission on 02/26/2018, Discharged on 02/28/2018  Component Date Value Ref Range Status  . WBC 02/26/2018 18.7* 3.6 - 11.0 K/uL Final  . RBC 02/26/2018 4.97  3.80 - 5.20 MIL/uL Final  . Hemoglobin 02/26/2018 14.2  12.0 - 16.0 g/dL Final  . HCT 02/26/2018 43.3  35.0 - 47.0 % Final  . MCV 02/26/2018 87.1  80.0 - 100.0 fL Final  . MCH 02/26/2018 28.5  26.0 - 34.0 pg Final  . MCHC 02/26/2018 32.7  32.0 - 36.0 g/dL Final  . RDW 02/26/2018 13.9  11.5 - 14.5 % Final  . Platelets 02/26/2018 355  150 - 440 K/uL Final  . Neutrophils Relative % 02/26/2018 87  % Final  . Neutro Abs 02/26/2018 16.1* 1.4 - 6.5 K/uL Final  . Lymphocytes Relative 02/26/2018 10  % Final  . Lymphs Abs 02/26/2018 1.9  1.0 - 3.6 K/uL Final  . Monocytes Relative 02/26/2018 3  % Final  . Monocytes Absolute 02/26/2018 0.6  0.2 - 0.9 K/uL Final  . Eosinophils Relative 02/26/2018 0  % Final  . Eosinophils Absolute 02/26/2018 0.1  0 - 0.7 K/uL Final  . Basophils Relative 02/26/2018 0  % Final  . Basophils Absolute 02/26/2018 0.0  0 - 0.1 K/uL Final   Performed at [facility], [address], [place], [place] [address]  . Sodium 02/26/2018 139  135 - 145 mmol/L Final  . Potassium 02/26/2018 3.9  3.5  - 5.1 mmol/L Final  . Chloride 02/26/2018 105  101 - 111 mmol/L Final  . CO2 02/26/2018 23  22 - 32 mmol/L Final  . Glucose, Bld 02/26/2018 97  65 - 99 mg/dL Final  . BUN 02/26/2018 9  6 - 20 mg/dL Final  . Creatinine, Ser 02/26/2018 0.72  0.44 - 1.00 mg/dL Final  . Calcium 02/26/2018 9.7  8.9 - 10.3 mg/dL Final  . Total Protein 02/26/2018 8.0  6.5 - 8.1 g/dL Final  . Albumin 02/26/2018 4.7  3.5 - 5.0 g/dL Final  . AST 02/26/2018 33  15 - 41 U/L Final  . ALT 02/26/2018 27  14 - 54 U/L Final  . Alkaline Phosphatase 02/26/2018 81  38 - 126 U/L Final  . Total Bilirubin 02/26/2018 0.6  0.3 - 1.2 mg/dL Final  . GFR calc non Af Amer 02/26/2018 >60  >60 mL/min Final  . GFR calc Af Amer 02/26/2018 >60  >60 mL/min Final   Comment: (NOTE) The eGFR has been calculated using the CKD EPI equation. This calculation has not been validated in all clinical situations. eGFR's persistently <60 mL/min signify possible Chronic Kidney Disease.   Georgiann Hahn gap 02/26/2018 11  5 - 15 Final   Performed at [facility], [address] [place], [place] [address]  . Lipase 02/26/2018 24  11 - 51 U/L Final   Performed at [facility], [address] [place], [place] [address]  . Color, Urine 02/26/2018 YELLOW* YELLOW Final  . APPearance 02/26/2018 CLEAR* CLEAR Final  . Specific Gravity, Urine 02/26/2018 1.038* 1.005 - 1.030 Final  . pH 02/26/2018 7.0  5.0 - 8.0 Final  . Glucose, UA 02/26/2018 NEGATIVE  NEGATIVE mg/dL Final  . Hgb urine dipstick 02/26/2018 NEGATIVE  NEGATIVE Final  . Bilirubin Urine 02/26/2018 NEGATIVE  NEGATIVE Final  . Ketones, ur 02/26/2018 20* NEGATIVE mg/dL Final  . Protein, ur 02/26/2018 NEGATIVE  NEGATIVE mg/dL Final  . Nitrite 02/26/2018 NEGATIVE  NEGATIVE Final  . Leukocytes, UA 02/26/2018 NEGATIVE  NEGATIVE Final  . RBC / HPF 02/26/2018 0-5  0 - 5 RBC/hpf Final  . WBC, UA 02/26/2018 0-5  0 - 5 WBC/hpf Final  . Bacteria, UA 02/26/2018 NONE SEEN  NONE SEEN Final    . Squamous Epithelial / LPF 02/26/2018 6-30* NONE SEEN Final  . Mucus 02/26/2018 PRESENT   Final   Performed at [facility], [address] [place], [place] [address]  . hCG, Beta Chain, Quant, S 02/26/2018 <1  <5 mIU/mL Final   Comment:          GEST. AGE      CONC.  (mIU/mL)   <=1 WEEK        5 - 50     2 WEEKS       50 - 500     3 WEEKS       100 - 10,000     4 WEEKS     1,000 - 30,000     5 WEEKS     3,500 - 115,000   6-8 WEEKS     12,000 - 270,000    12 WEEKS     15,000 - 220,000        FEMALE AND NON-PREGNANT FEMALE:     LESS THAN 5 mIU/mL Performed at [facility], [address] [place], [place] [address]   . Prothrombin Time 02/26/2018 13.8  11.4 - 15.2 seconds Final  . INR 02/26/2018 1.07   Final   Performed at [facility], [address] [place], [place] [address]  . aPTT 02/26/2018 30  24 - 36 seconds Final   Performed at [facility], [address] [place], [place] [address]  . SURGICAL PATHOLOGY 02/26/2018    Final                   Value:Surgical Pathology CASE: [id number] PATIENT: Kimberly [place] Surgical Pathology Report     SPECIMEN SUBMITTED: A. Ovarian cyst, left B. Appendix  CLINICAL HISTORY: None provided  PRE-OPERATIVE DIAGNOSIS: Left ovarian cyst, appendicitis  POST-OPERATIVE DIAGNOSIS: Same as pre-op     DIAGNOSIS: A.  LEFT OVARIAN CYST; LAPAROSCOPIC OVARIAN CYSTECTOMY: - FOCAL FEATURES CONSISTENT WITH HEMORRHAGIC FOLLICULAR CYST.  B.  APPENDIX; LAPAROSCOPIC APPENDECTOMY: - ACUTE APPENDICITIS AND SEROSITIS. - NEGATIVE FOR DYSPLASIA AND MALIGNANCY.   GROSS DESCRIPTION:  A. Labeled: left ovarian cyst  Tissue fragment(s): 3  Size: aggregate, 1.7 x 0.4 x 0.1 cm  Description: in formalin, purple to tan fragments  Entirely submitted in 1 cassette(s).  B. Labeled: appendix  Size:  7.3 x 1.0 x 1.0 cm  External surface: purple to tan with focal fibrinopurulent  material  Perforation: none grossly identified  Fecalith: no  Description:  The proximal margin is inked                            blue. Specimen focally contains purulent material.  Block summary: 1 - representative sections        Final Diagnosis performed by Dana Baker, MD.  Electronically signed 02/28/2018 3:36:13PM    The electronic signature indicates that the named Attending Pathologist has evaluated the specimen  Technical component performed at [facility], [address], [place], [place] [address] Lab: [phone] Dir: Sanjai Nagendra, MD, MMM  Professional component performed at [facility], [facility], [address], [place], [place] [address] Lab: [phone] Dir: Tara C. Rubinas, MD      Assessment: 21 y.o. s/p laparoscopic appendectomy and left ovarian cystectomy stable  Plan: Patient has done well after surgery with no apparent complications.  I have discussed the post-operative course to date, and the expected progress moving forward.  The patient understands what complications to be concerned about.  I will see the patient in routine follow up, or sooner if needed.    Activity plan: No restriction.  - declines contraception - GC/CT cultures repeat at 6 week postop visit - Vicodin rx and promethazine for nausea  Return in about 5 weeks (around 04/10/2018) for postop.    Andreas Staebler, MD, FACOG [facility], [facility] 03/06/2018, 1:12 PM  Declines contra 

## 2018-03-11 ENCOUNTER — Encounter: Payer: Self-pay | Admitting: Gastroenterology

## 2018-03-11 ENCOUNTER — Ambulatory Visit: Payer: BLUE CROSS/BLUE SHIELD | Admitting: Gastroenterology

## 2018-03-19 ENCOUNTER — Encounter: Payer: Self-pay | Admitting: Surgery

## 2018-03-20 ENCOUNTER — Encounter: Payer: Self-pay | Admitting: Surgery

## 2018-03-20 ENCOUNTER — Telehealth: Payer: Self-pay | Admitting: Surgery

## 2018-03-20 NOTE — Telephone Encounter (Signed)
I was unable to leave a message for the patient to call the office, patient no showed appointment on 03/19/18 with Dr. Hampton Abbot, I have mailed a letter for the patient to contact our office to reschedule no showed appointment.

## 2018-04-09 ENCOUNTER — Telehealth: Payer: Self-pay

## 2018-04-09 ENCOUNTER — Encounter: Payer: Self-pay | Admitting: Family Medicine

## 2018-04-09 ENCOUNTER — Ambulatory Visit: Payer: BLUE CROSS/BLUE SHIELD | Admitting: Obstetrics and Gynecology

## 2018-04-09 ENCOUNTER — Ambulatory Visit: Payer: BLUE CROSS/BLUE SHIELD | Admitting: Family Medicine

## 2018-04-09 VITALS — BP 116/68 | HR 83 | Temp 98.5°F | Resp 16 | Ht 67.0 in | Wt 136.1 lb

## 2018-04-09 DIAGNOSIS — Z113 Encounter for screening for infections with a predominantly sexual mode of transmission: Secondary | ICD-10-CM | POA: Diagnosis not present

## 2018-04-09 DIAGNOSIS — J302 Other seasonal allergic rhinitis: Secondary | ICD-10-CM

## 2018-04-09 DIAGNOSIS — L309 Dermatitis, unspecified: Secondary | ICD-10-CM

## 2018-04-09 DIAGNOSIS — N912 Amenorrhea, unspecified: Secondary | ICD-10-CM | POA: Diagnosis not present

## 2018-04-09 DIAGNOSIS — J3089 Other allergic rhinitis: Secondary | ICD-10-CM

## 2018-04-09 DIAGNOSIS — F339 Major depressive disorder, recurrent, unspecified: Secondary | ICD-10-CM

## 2018-04-09 LAB — POCT URINE PREGNANCY: PREG TEST UR: NEGATIVE

## 2018-04-09 MED ORDER — OLOPATADINE HCL 0.2 % OP SOLN: 1.0000 [drp] | Freq: Two times a day (BID) | OPHTHALMIC | 2 refills | Status: DC | PRN
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
## 2018-04-09 NOTE — Telephone Encounter (Signed)
Copied from [place] [id number]. Topic: General - Other >> Apr 09, 2018  4:37 PM Yvette Rack wrote: Renee with [facility] called in stating the Rx is not clear. Joseph Art is asking if the eye drops should be used in one eye or both eyes. Cb# [phone]

## 2018-04-10 LAB — HIV ANTIBODY (ROUTINE TESTING W REFLEX): HIV: NONREACTIVE

## 2018-04-10 LAB — HEPATITIS PANEL, ACUTE
HEP B S AG: NONREACTIVE
Hep A IgM: NONREACTIVE
Hep B C IgM: NONREACTIVE
Hepatitis C Ab: NONREACTIVE
SIGNAL TO CUT-OFF: 0.04 (ref <1.00)

## 2018-04-10 LAB — RPR: RPR Ser Ql: NONREACTIVE

## 2018-04-11 ENCOUNTER — Other Ambulatory Visit: Payer: Self-pay

## 2018-04-11 DIAGNOSIS — J302 Other seasonal allergic rhinitis: Secondary | ICD-10-CM

## 2018-04-11 DIAGNOSIS — J3089 Other allergic rhinitis: Principal | ICD-10-CM

## 2018-04-11 MED ORDER — CETIRIZINE HCL 10 MG PO TABS: 10.0000 mg | ORAL_TABLET | Freq: Every day | ORAL | 5 refills | Status: DC

## 2018-04-11 NOTE — Telephone Encounter (Signed)
Refill request for general medication. Cetirizine to [facility].  Last office visit: 04/09/2018   No follow-ups on file.

## 2018-04-23 ENCOUNTER — Ambulatory Visit: Payer: BLUE CROSS/BLUE SHIELD | Admitting: Obstetrics and Gynecology

## 2018-05-02 ENCOUNTER — Other Ambulatory Visit: Payer: Self-pay | Admitting: Family Medicine

## 2018-05-02 ENCOUNTER — Other Ambulatory Visit: Payer: Self-pay

## 2018-05-02 ENCOUNTER — Telehealth: Payer: Self-pay | Admitting: Family Medicine

## 2018-05-02 DIAGNOSIS — Z113 Encounter for screening for infections with a predominantly sexual mode of transmission: Secondary | ICD-10-CM

## 2018-05-02 DIAGNOSIS — R350 Frequency of micturition: Secondary | ICD-10-CM

## 2018-05-02 DIAGNOSIS — Z7251 High risk heterosexual behavior: Secondary | ICD-10-CM

## 2018-05-02 DIAGNOSIS — R3911 Hesitancy of micturition: Secondary | ICD-10-CM

## 2018-05-02 NOTE — Telephone Encounter (Signed)
Patient GC/Chl. Never came back and patient wants to come back in to get another sample for the test. Patient states she is having new onset of UTI symptoms and ask we do that test along with the GC/Ch one on today's specimen.

## 2018-05-02 NOTE — Telephone Encounter (Signed)
Copied from [place] [id number]. Topic: Inquiry >> May 02, 2018 10:06 AM Margot Ables wrote: Reason for CRM: pt asking about her previous UA stating she did not get results. Pt believes she has a UTI. Sx include frequent urination and uncomfortable when trying to go, she has to go but cannot, pt asking for medication.  [place] [facility], [place], [place] - [address] [place] [address] Phone: [phone] Fax: [phone]

## 2018-05-02 NOTE — Addendum Note (Signed)
Addended by: Inda Coke on: 05/02/2018 04:08 PM   Modules accepted: Orders

## 2018-05-02 NOTE — Telephone Encounter (Signed)
okay

## 2018-05-05 LAB — URINE CULTURE
MICRO NUMBER: 90635411
SPECIMEN QUALITY: ADEQUATE

## 2018-05-05 LAB — C. TRACHOMATIS/N. GONORRHOEAE RNA
C. trachomatis RNA, TMA: NOT DETECTED
N. gonorrhoeae RNA, TMA: NOT DETECTED

## 2018-05-06 ENCOUNTER — Other Ambulatory Visit: Payer: Self-pay | Admitting: Family Medicine

## 2018-05-06 MED ORDER — NITROFURANTOIN MONOHYD MACRO 100 MG PO CAPS: 100.0000 mg | ORAL_CAPSULE | Freq: Two times a day (BID) | ORAL | 0 refills | Status: DC

## 2018-07-08 ENCOUNTER — Ambulatory Visit: Payer: BLUE CROSS/BLUE SHIELD | Admitting: Family Medicine

## 2018-07-17 ENCOUNTER — Other Ambulatory Visit (HOSPITAL_COMMUNITY)
Admission: RE | Admit: 2018-07-17 | Discharge: 2018-07-17 | Disposition: A | Discharge status: Home / Self Care | Payer: BLUE CROSS/BLUE SHIELD | Source: Ambulatory Visit | Attending: Family Medicine | Admitting: Family Medicine

## 2018-07-17 ENCOUNTER — Encounter: Payer: Self-pay | Admitting: Family Medicine

## 2018-07-17 ENCOUNTER — Ambulatory Visit (INDEPENDENT_AMBULATORY_CARE_PROVIDER_SITE_OTHER): Payer: BLUE CROSS/BLUE SHIELD | Admitting: Family Medicine

## 2018-07-17 VITALS — BP 120/70 | HR 80 | Temp 98.4°F | Resp 20 | Ht 67.0 in | Wt 138.8 lb

## 2018-07-17 DIAGNOSIS — N946 Dysmenorrhea, unspecified: Secondary | ICD-10-CM

## 2018-07-17 DIAGNOSIS — Z01419 Encounter for gynecological examination (general) (routine) without abnormal findings: Secondary | ICD-10-CM | POA: Diagnosis not present

## 2018-07-17 DIAGNOSIS — F339 Major depressive disorder, recurrent, unspecified: Secondary | ICD-10-CM

## 2018-07-17 DIAGNOSIS — Z113 Encounter for screening for infections with a predominantly sexual mode of transmission: Secondary | ICD-10-CM | POA: Diagnosis not present

## 2018-07-17 DIAGNOSIS — Z124 Encounter for screening for malignant neoplasm of cervix: Secondary | ICD-10-CM | POA: Diagnosis present

## 2018-07-17 DIAGNOSIS — Z114 Encounter for screening for human immunodeficiency virus [HIV]: Secondary | ICD-10-CM | POA: Diagnosis not present

## 2018-07-17 DIAGNOSIS — Z23 Encounter for immunization: Secondary | ICD-10-CM

## 2018-07-17 DIAGNOSIS — G4709 Other insomnia: Secondary | ICD-10-CM

## 2018-07-17 DIAGNOSIS — R350 Frequency of micturition: Secondary | ICD-10-CM | POA: Diagnosis not present

## 2018-07-17 DIAGNOSIS — B001 Herpesviral vesicular dermatitis: Secondary | ICD-10-CM

## 2018-07-17 DIAGNOSIS — Z13220 Encounter for screening for lipoid disorders: Secondary | ICD-10-CM

## 2018-07-17 DIAGNOSIS — Z833 Family history of diabetes mellitus: Secondary | ICD-10-CM

## 2018-07-17 DIAGNOSIS — Z5181 Encounter for therapeutic drug level monitoring: Secondary | ICD-10-CM

## 2018-07-17 MED ORDER — VALACYCLOVIR HCL 1 G PO TABS: 2000.0000 mg | ORAL_TABLET | Freq: Two times a day (BID) | ORAL | 0 refills | Status: DC

## 2018-07-17 MED ORDER — DULOXETINE HCL 60 MG PO CPEP: 60.0000 mg | ORAL_CAPSULE | Freq: Every day | ORAL | 5 refills | Status: DC

## 2018-07-17 MED ORDER — TRAZODONE HCL 50 MG PO TABS: 25.0000 mg | ORAL_TABLET | Freq: Every evening | ORAL | 5 refills | Status: DC | PRN

## 2018-07-17 NOTE — Patient Instructions (Signed)
Preventive Care 18-39 Years, Female Preventive care refers to lifestyle choices and visits with your health care provider that can promote health and wellness. What does preventive care include?  A yearly physical exam. This is also called an annual well check.  Dental exams once or twice a year.  Routine eye exams. Ask your health care provider how often you should have your eyes checked.  Personal lifestyle choices, including: ? Daily care of your teeth and gums. ? Regular physical activity. ? Eating a healthy diet. ? Avoiding tobacco and drug use. ? Limiting alcohol use. ? Practicing safe sex. ? Taking vitamin and mineral supplements as recommended by your health care provider. What happens during an annual well check? The services and screenings done by your health care provider during your annual well check will depend on your age, overall health, lifestyle risk factors, and family history of disease. Counseling Your health care provider may ask you questions about your:  Alcohol use.  Tobacco use.  Drug use.  Emotional well-being.  Home and relationship well-being.  Sexual activity.  Eating habits.  Work and work Statistician.  Method of birth control.  Menstrual cycle.  Pregnancy history.  Screening You may have the following tests or measurements:  Height, weight, and BMI.  Diabetes screening. This is done by checking your blood sugar (glucose) after you have not eaten for a while (fasting).  Blood pressure.  Lipid and cholesterol levels. These may be checked every 5 years starting at age 66.  Skin check.  Hepatitis C blood test.  Hepatitis B blood test.  Sexually transmitted disease (STD) testing.  BRCA-related cancer screening. This may be done if you have a family history of breast, ovarian, tubal, or peritoneal cancers.  Pelvic exam and Pap test. This may be done every 3 years starting at age 40. Starting at age 59, this may be done every 5  years if you have a Pap test in combination with an HPV test.  Discuss your test results, treatment options, and if necessary, the need for more tests with your health care provider. Vaccines Your health care provider may recommend certain vaccines, such as:  Influenza vaccine. This is recommended every year.  Tetanus, diphtheria, and acellular pertussis (Tdap, Td) vaccine. You may need a Td booster every 10 years.  Varicella vaccine. You may need this if you have not been vaccinated.  HPV vaccine. If you are 69 or younger, you may need three doses over 6 months.  Measles, mumps, and rubella (MMR) vaccine. You may need at least one dose of MMR. You may also need a second dose.  Pneumococcal 13-valent conjugate (PCV13) vaccine. You may need this if you have certain conditions and were not previously vaccinated.  Pneumococcal polysaccharide (PPSV23) vaccine. You may need one or two doses if you smoke cigarettes or if you have certain conditions.  Meningococcal vaccine. One dose is recommended if you are age 27-21 years and a first-year college student living in a residence hall, or if you have one of several medical conditions. You may also need additional booster doses.  Hepatitis A vaccine. You may need this if you have certain conditions or if you travel or work in places where you may be exposed to hepatitis A.  Hepatitis B vaccine. You may need this if you have certain conditions or if you travel or work in places where you may be exposed to hepatitis B.  Haemophilus influenzae type b (Hib) vaccine. You may need this if  you have certain risk factors.  Talk to your health care provider about which screenings and vaccines you need and how often you need them. This information is not intended to replace advice given to you by your health care provider. Make sure you discuss any questions you have with your health care provider. Document Released: 01/22/2002 Document Revised: 08/15/2016  Document Reviewed: 09/27/2015 Elsevier Interactive Patient Education  [employer].
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
## 2018-07-18 ENCOUNTER — Telehealth: Payer: Self-pay | Admitting: Family Medicine

## 2018-07-18 ENCOUNTER — Encounter: Payer: Self-pay | Admitting: Family Medicine

## 2018-07-18 ENCOUNTER — Ambulatory Visit: Payer: BLUE CROSS/BLUE SHIELD | Admitting: Family Medicine

## 2018-07-18 VITALS — BP 110/68 | HR 80 | Temp 98.7°F | Resp 16 | Ht 67.0 in | Wt 142.8 lb

## 2018-07-18 DIAGNOSIS — N760 Acute vaginitis: Secondary | ICD-10-CM

## 2018-07-18 DIAGNOSIS — B9689 Other specified bacterial agents as the cause of diseases classified elsewhere: Secondary | ICD-10-CM | POA: Diagnosis not present

## 2018-07-18 DIAGNOSIS — A749 Chlamydial infection, unspecified: Secondary | ICD-10-CM | POA: Diagnosis not present

## 2018-07-18 DIAGNOSIS — R110 Nausea: Secondary | ICD-10-CM | POA: Diagnosis not present

## 2018-07-18 LAB — CBC
HCT: 43.8 % (ref 35.0–45.0)
Hemoglobin: 14 g/dL (ref 11.7–15.5)
MCH: 28.6 pg (ref 27.0–33.0)
MCHC: 32 g/dL (ref 32.0–36.0)
MCV: 89.4 fL (ref 80.0–100.0)
MPV: 10.8 fL (ref 7.5–12.5)
Platelets: 317 10*3/uL (ref 140–400)
RBC: 4.9 10*6/uL (ref 3.80–5.10)
RDW: 12.2 % (ref 11.0–15.0)
WBC: 11.3 10*3/uL — AB (ref 3.8–10.8)

## 2018-07-18 LAB — COMPLETE METABOLIC PANEL WITH GFR
AG RATIO: 1.9 (calc) (ref 1.0–2.5)
ALKALINE PHOSPHATASE (APISO): 76 U/L (ref 33–115)
ALT: 16 U/L (ref 6–29)
AST: 19 U/L (ref 10–30)
Albumin: 4.6 g/dL (ref 3.6–5.1)
BILIRUBIN TOTAL: 0.6 mg/dL (ref 0.2–1.2)
BUN: 10 mg/dL (ref 7–25)
CHLORIDE: 104 mmol/L (ref 98–110)
CO2: 28 mmol/L (ref 20–32)
Calcium: 9.7 mg/dL (ref 8.6–10.2)
Creat: 0.81 mg/dL (ref 0.50–1.10)
GFR, Est African American: 120 mL/min/{1.73_m2} (ref >=60)
GFR, Est Non African American: 104 mL/min/{1.73_m2} (ref >=60)
GLOBULIN: 2.4 g/dL (ref 1.9–3.7)
Glucose, Bld: 83 mg/dL (ref 65–99)
Potassium: 3.8 mmol/L (ref 3.5–5.3)
SODIUM: 140 mmol/L (ref 135–146)
Total Protein: 7 g/dL (ref 6.1–8.1)

## 2018-07-18 LAB — URINALYSIS, MICROSCOPIC ONLY
BACTERIA UA: NONE SEEN /HPF
HYALINE CAST: NONE SEEN /LPF
RBC / HPF: NONE SEEN /HPF (ref 0–2)

## 2018-07-18 LAB — LIPID PANEL
CHOLESTEROL: 122 mg/dL (ref <200)
HDL: 58 mg/dL (ref >50)
LDL CHOLESTEROL (CALC): 50 mg/dL
Non-HDL Cholesterol (Calc): 64 mg/dL (calc) (ref <130)
TRIGLYCERIDES: 61 mg/dL (ref <150)
Total CHOL/HDL Ratio: 2.1 (calc) (ref <5.0)

## 2018-07-18 LAB — CYTOLOGY - PAP
Adequacy: ABSENT
Bacterial vaginitis: POSITIVE — AB
Chlamydia: POSITIVE — AB
DIAGNOSIS: NEGATIVE
NEISSERIA GONORRHEA: NEGATIVE
TRICH (WINDOWPATH): NEGATIVE

## 2018-07-18 LAB — URINE CULTURE
MICRO NUMBER: 90941347
SPECIMEN QUALITY: ADEQUATE

## 2018-07-18 LAB — HIV ANTIBODY (ROUTINE TESTING W REFLEX): HIV 1&2 Ab, 4th Generation: NONREACTIVE

## 2018-07-18 LAB — RPR: RPR Ser Ql: NONREACTIVE

## 2018-07-18 MED ORDER — METRONIDAZOLE 500 MG PO TABS: 500.0000 mg | ORAL_TABLET | Freq: Two times a day (BID) | ORAL | 0 refills | Status: DC

## 2018-07-18 MED ORDER — ONDANSETRON HCL 4 MG PO TABS: 4.0000 mg | ORAL_TABLET | Freq: Three times a day (TID) | ORAL | 0 refills | Status: DC | PRN

## 2018-07-18 MED ORDER — AZITHROMYCIN 500 MG PO TABS: 1000.0000 mg | ORAL_TABLET | Freq: Once | ORAL | Status: DC

## 2018-07-18 NOTE — Patient Instructions (Signed)
Chlamydia, Female Chlamydia is an STD (sexually transmitted disease). This is an infection that spreads through sexual contact. If it is not treated, it can cause serious problems. It must be treated with antibiotic medicine. Sometimes, you may not have symptoms (asymptomatic). When you have symptoms, they can include:  Burning when you pee (urinate).  Peeing often.  Fluid (discharge) coming from the vagina.  Redness, soreness, and swelling (inflammation) of the butt (rectum).  Bleeding or fluid coming from the butt.  Belly (abdominal) pain.  Pain during sex.  Bleeding between periods.  Itching, burning, or redness in the eyes.  Fluid coming from the eyes.  Follow these instructions at home: Medicines  Take over-the-counter and prescription medicines only as told by your doctor.  Take your antibiotic medicine as told by your doctor. Do not stop taking the antibiotic even if you start to feel better. Sexual activity  Tell sex partners about your infection. Sex partners are people you had oral, anal, or vaginal sex with within 60 days of when you started getting sick. They need treatment, too.  Do not have sex until: ? You and your sex partners have been treated. ? Your doctor says it is okay.  If you have a single dose treatment, wait 7 days before having sex. General instructions  It is up to you to get your test results. Ask your doctor when your results will be ready.  Get a lot of rest.  Eat healthy foods.  Drink enough fluid to keep your pee (urine) clear or pale yellow.  Keep all follow-up visits as told by your doctor. You may need tests after 3 months. Preventing chlamydia  The only way to prevent chlamydia is not to have sex. To lower your risk: ? Use latex condoms correctly. Do this every time you have sex. ? Avoid having many sex partners. ? Ask if your partner has been tested for STDs and if he or she had negative results. Contact a doctor if:  You  get new symptoms.  You do not get better with treatment.  You have a fever or chills.  You have pain during sex. Get help right away if:  Your pain gets worse and does not get better with medicine.  You get flu-like symptoms, such as: ? Night sweats. ? Sore throat. ? Muscle aches.  You feel sick to your stomach (nauseous).  You throw up (vomit).  You have trouble swallowing.  You have bleeding: ? Between periods. ? After sex.  You have irregular periods.  You have belly pain that does not get better with medicine.  You have lower back pain that does not get better with medicine.  You feel weak or dizzy.  You pass out (faint).  You are pregnant and you get symptoms of chlamydia. Summary  Chlamydia is an infection that spreads through sexual contact.  Sometimes, chlamydia can cause no symptoms (asymptomatic).  Do not have sex until your doctor says it is okay.  All sex partners will have to be treated for chlamydia. This information is not intended to replace advice given to you by your health care provider. Make sure you discuss any questions you have with your health care provider. Document Released: 09/04/2008 Document Revised: 11/15/2016 Document Reviewed: 11/15/2016 Elsevier Interactive Patient Education  2017 [employer]. Sexually Transmitted Disease A sexually transmitted disease (STD) is a disease or infection that may be passed (transmitted) from person to person, usually during sexual activity. This may happen by way of  saliva, semen, blood, vaginal mucus, or urine. Common STDs include:  Gonorrhea.  Chlamydia.  Syphilis.  HIV and AIDS.  Genital herpes.  Hepatitis B and C.  Trichomonas.  Human papillomavirus (HPV).  Pubic lice.  Scabies.  Mites.  Bacterial vaginosis.  What are the causes? An STD may be caused by bacteria, a virus, or parasites. STDs are often transmitted during sexual activity if one person is infected. However,  they may also be transmitted through nonsexual means. STDs may be transmitted after:  Sexual intercourse with an infected person.  Sharing sex toys with an infected person.  Sharing needles with an infected person or using unclean piercing or tattoo needles.  Having intimate contact with the genitals, mouth, or rectal areas of an infected person.  Exposure to infected fluids during birth.  What are the signs or symptoms? Different STDs have different symptoms. Some people may not have any symptoms. If symptoms are present, they may include:  Painful or bloody urination.  Pain in the pelvis, abdomen, vagina, anus, throat, or eyes.  A skin rash, itching, or irritation.  Growths, ulcerations, blisters, or sores in the genital and anal areas.  Abnormal vaginal discharge with or without bad odor.  Penile discharge in men.  Fever.  Pain or bleeding during sexual intercourse.  Swollen glands in the groin area.  Yellow skin and eyes (jaundice). This is seen with hepatitis.  Swollen testicles.  Infertility.  Sores and blisters in the mouth.  How is this diagnosed? To make a diagnosis, your health care provider may:  Take a medical history.  Perform a physical exam.  Take a sample of any discharge to examine.  Swab the throat, cervix, opening to the penis, rectum, or vagina for testing.  Test a sample of your first morning urine.  Perform blood tests.  Perform a Pap test, if this applies.  Perform a colposcopy.  Perform a laparoscopy.  How is this treated? Treatment depends on the STD. Some STDs may be treated but not cured.  Chlamydia, gonorrhea, trichomonas, and syphilis can be cured with antibiotic medicine.  Genital herpes, hepatitis, and HIV can be treated, but not cured, with prescribed medicines. The medicines lessen symptoms.  Genital warts from HPV can be treated with medicine or by freezing, burning (electrocautery), or surgery. Warts may come  back.  HPV cannot be cured with medicine or surgery. However, abnormal areas may be removed from the cervix, vagina, or vulva.  If your diagnosis is confirmed, your recent sexual partners need treatment. This is true even if they are symptom-free or have a negative culture or evaluation. They should not have sex until their health care providers say it is okay.  Your health care provider may test you for infection again 3 months after treatment.  How is this prevented? Take these steps to reduce your risk of getting an STD:  Use latex condoms, dental dams, and water-soluble lubricants during sexual activity. Do not use petroleum jelly or oils.  Avoid having multiple sex partners.  Do not have sex with someone who has other sex partners.  Do not have sex with anyone you do not know or who is at high risk for an STD.  Avoid risky sex practices that can break your skin.  Do not have sex if you have open sores on your mouth or skin.  Avoid drinking too much alcohol or taking illegal drugs. Alcohol and drugs can affect your judgment and put you in a vulnerable position.  Avoid  engaging in oral and anal sex acts.  Get vaccinated for HPV and hepatitis. If you have not received these vaccines in the past, talk to your health care provider about whether one or both might be right for you.  If you are at risk of being infected with HIV, it is recommended that you take a prescription medicine daily to prevent HIV infection. This is called pre-exposure prophylaxis (PrEP). You are considered at risk if: ? You are a man who has sex with other men (MSM). ? You are a heterosexual man or woman and are sexually active with more than one partner. ? You take drugs by injection. ? You are sexually active with a partner who has HIV.  Talk with your health care provider about whether you are at high risk of being infected with HIV. If you choose to begin PrEP, you should first be tested for HIV. You  should then be tested every 3 months for as long as you are taking PrEP.  Contact a health care provider if:  See your health care provider.  Tell your sexual partner(s). They should be tested and treated for any STDs.  Do not have sex until your health care provider says it is okay. Get help right away if: Contact your health care provider right away if:  You have severe abdominal pain.  You are a man and notice swelling or pain in your testicles.  You are a woman and notice swelling or pain in your vagina.  This information is not intended to replace advice given to you by your health care provider. Make sure you discuss any questions you have with your health care provider. Document Released: 02/16/2003 Document Revised: 06/15/2016 Document Reviewed: 06/16/2013 Elsevier Interactive Patient Education  2018 [employer].
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
## 2018-07-18 NOTE — Telephone Encounter (Signed)
Patient called to state she vomited after taking azithromycin - she states vomitus was clear liquid with no pills present. Advised if she does vomit again and sees evidence of the azithromycen tablets, to call our office back. Otherwise she should take the Zofran that was sent in to her and return in 3-4 weeks for retesting.

## 2018-08-22 ENCOUNTER — Other Ambulatory Visit (HOSPITAL_COMMUNITY)
Admission: RE | Admit: 2018-08-22 | Discharge: 2018-08-22 | Disposition: A | Discharge status: Home / Self Care | Payer: BLUE CROSS/BLUE SHIELD | Source: Ambulatory Visit | Attending: Family Medicine | Admitting: Family Medicine

## 2018-08-22 ENCOUNTER — Ambulatory Visit: Payer: BLUE CROSS/BLUE SHIELD

## 2018-08-22 DIAGNOSIS — A749 Chlamydial infection, unspecified: Secondary | ICD-10-CM

## 2018-08-22 NOTE — Progress Notes (Signed)
g

## 2018-08-25 LAB — URINE CYTOLOGY ANCILLARY ONLY
Chlamydia: NEGATIVE
Neisseria Gonorrhea: NEGATIVE

## 2018-10-20 ENCOUNTER — Telehealth: Payer: Self-pay | Admitting: Family Medicine

## 2018-10-20 ENCOUNTER — Encounter: Payer: Self-pay | Admitting: Family Medicine

## 2018-10-20 ENCOUNTER — Ambulatory Visit: Payer: BLUE CROSS/BLUE SHIELD | Admitting: Family Medicine

## 2018-10-20 ENCOUNTER — Ambulatory Visit (INDEPENDENT_AMBULATORY_CARE_PROVIDER_SITE_OTHER): Payer: BLUE CROSS/BLUE SHIELD | Admitting: Family Medicine

## 2018-10-20 VITALS — BP 120/72 | HR 72 | Temp 98.2°F | Resp 16 | Ht 67.0 in | Wt 142.2 lb

## 2018-10-20 DIAGNOSIS — Z23 Encounter for immunization: Secondary | ICD-10-CM | POA: Diagnosis not present

## 2018-10-20 DIAGNOSIS — F39 Unspecified mood [affective] disorder: Secondary | ICD-10-CM | POA: Diagnosis not present

## 2018-10-20 DIAGNOSIS — F339 Major depressive disorder, recurrent, unspecified: Secondary | ICD-10-CM

## 2018-10-20 DIAGNOSIS — R5383 Other fatigue: Secondary | ICD-10-CM | POA: Diagnosis not present

## 2018-10-20 NOTE — Telephone Encounter (Signed)
Copied from [place] [phone]. Topic: Referral - Question >> Oct 20, 2018  3:48 PM Alfredia Ferguson R wrote: Dr Nicolasa Ducking office called and stated they will not be able to accept the patient due to patient having medicaid
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
## 2018-10-20 NOTE — Telephone Encounter (Signed)
Please see psychiatry referral - +MDQ - ?Bipolar vs severe anxiety w/  Moderate recurrent depression.

## 2018-10-20 NOTE — Patient Instructions (Addendum)
[facility] - Address: [address], [place] - Telephone: [phone]  - Hours of Operation: Sunday - Saturday - 8:00 a.m. - 8:00 p.m. - Medicaid, Medicare (Government Issued Only), BCBS, and [place] Management, [place], Psychiatrists on-site to provide medication management, [place], and Peer Support Care.  You may also go to counseling at your student health center.

## 2018-10-21 ENCOUNTER — Other Ambulatory Visit: Payer: Self-pay | Admitting: Family Medicine

## 2018-10-21 DIAGNOSIS — D72829 Elevated white blood cell count, unspecified: Secondary | ICD-10-CM

## 2018-10-21 LAB — TSH: TSH: 1.98 mIU/L

## 2018-10-21 LAB — COMPLETE METABOLIC PANEL WITH GFR
AG Ratio: 2.1 (calc) (ref 1.0–2.5)
ALBUMIN MSPROF: 4.1 g/dL (ref 3.6–5.1)
ALT: 18 U/L (ref 6–29)
AST: 18 U/L (ref 10–30)
Alkaline phosphatase (APISO): 67 U/L (ref 33–115)
BILIRUBIN TOTAL: 0.3 mg/dL (ref 0.2–1.2)
BUN: 12 mg/dL (ref 7–25)
CALCIUM: 9.1 mg/dL (ref 8.6–10.2)
CO2: 30 mmol/L (ref 20–32)
Chloride: 107 mmol/L (ref 98–110)
Creat: 0.77 mg/dL (ref 0.50–1.10)
GFR, EST AFRICAN AMERICAN: 128 mL/min/{1.73_m2} (ref >=60)
GFR, EST NON AFRICAN AMERICAN: 110 mL/min/{1.73_m2} (ref >=60)
GLUCOSE: 80 mg/dL (ref 65–99)
Globulin: 2 g/dL (calc) (ref 1.9–3.7)
Potassium: 3.7 mmol/L (ref 3.5–5.3)
Sodium: 143 mmol/L (ref 135–146)
TOTAL PROTEIN: 6.1 g/dL (ref 6.1–8.1)

## 2018-10-21 LAB — CBC
HEMATOCRIT: 38.5 % (ref 35.0–45.0)
HEMOGLOBIN: 12.8 g/dL (ref 11.7–15.5)
MCH: 29.2 pg (ref 27.0–33.0)
MCHC: 33.2 g/dL (ref 32.0–36.0)
MCV: 87.9 fL (ref 80.0–100.0)
MPV: 11 fL (ref 7.5–12.5)
Platelets: 364 10*3/uL (ref 140–400)
RBC: 4.38 10*6/uL (ref 3.80–5.10)
RDW: 12.3 % (ref 11.0–15.0)
WBC: 13.2 10*3/uL — ABNORMAL HIGH (ref 3.8–10.8)

## 2018-11-03 ENCOUNTER — Other Ambulatory Visit (HOSPITAL_COMMUNITY)
Admission: RE | Admit: 2018-11-03 | Discharge: 2018-11-03 | Disposition: A | Discharge status: Home / Self Care | Payer: BLUE CROSS/BLUE SHIELD | Source: Ambulatory Visit | Attending: Family Medicine | Admitting: Family Medicine

## 2018-11-03 ENCOUNTER — Ambulatory Visit (INDEPENDENT_AMBULATORY_CARE_PROVIDER_SITE_OTHER): Payer: BLUE CROSS/BLUE SHIELD | Admitting: Family Medicine

## 2018-11-03 ENCOUNTER — Encounter: Payer: Self-pay | Admitting: Family Medicine

## 2018-11-03 VITALS — BP 100/60 | HR 80 | Temp 97.9°F | Resp 18 | Ht 67.0 in | Wt 140.1 lb

## 2018-11-03 DIAGNOSIS — Z113 Encounter for screening for infections with a predominantly sexual mode of transmission: Secondary | ICD-10-CM | POA: Insufficient documentation

## 2018-11-03 DIAGNOSIS — D72829 Elevated white blood cell count, unspecified: Secondary | ICD-10-CM

## 2018-11-03 DIAGNOSIS — F39 Unspecified mood [affective] disorder: Secondary | ICD-10-CM

## 2018-11-03 NOTE — Patient Instructions (Signed)
Here are some resources to help you if you feel you are in a mental health crisis:  [place] - Call [phone]  for help - Website with more resources: [email or web address].pt  [facility] [facility] - Call [phone] for help. - Mobile Crisis Program available 24 hours a day, 365 days a year. - Available for anyone of any age in [place] counties.  [facility] - Address: [address], [place] - Telephone: [phone]  - Hours of Operation: Sunday - Saturday - 8:00 a.m. - 8:00 p.m. - Medicaid, Medicare (Government Issued Only), BCBS, and [place] Management, [place], Psychiatrists on-site to provide medication management, [place], and Peer Support Care.  [facility] - Call [phone] for help. - Mobile Crisis Program available 24 hours a day, 365 days a year. - Available for anyone of any age in [place]  ---------------------------------------------------------------------------------------------------  12 Ways to Curb Anxiety  ?Anxiety is normal human sensation. It is what helped our ancestors survive the pitfalls of the wilderness. Anxiety is defined as experiencing worry or nervousness about an imminent event or something with an uncertain outcome. It is a feeling experienced by most people at some point in their lives. Anxiety can be triggered by a very personal issue, such as the illness of a loved one, or an event of global proportions, such as a refugee crisis. Some of the symptoms of anxiety are:  Feeling restless.  Having a feeling of impending danger.  Increased heart rate.  Rapid breathing. Sweating.  Shaking.  Weakness or feeling tired.  Difficulty concentrating on anything except the current worry.  Insomnia.  Stomach or bowel problems. What can we do  about anxiety we may be feeling? There are many techniques to help manage stress and relax. Here are 12 ways you can reduce your anxiety almost immediately: 1. Turn off the constant feed of information. Take a social media sabbatical. Studies have shown that social media directly contributes to social anxiety.  2. Monitor your television viewing habits. Are you watching shows that are also contributing to your anxiety, such as 24-hour news stations? Try watching something else, or better yet, nothing at all. Instead, listen to music, read an inspirational book or practice a hobby. 3. Eat nutritious meals. Also, don't skip meals and keep healthful snacks on hand. Hunger and poor diet contributes to feeling anxious. 4. Sleep. Sleeping on a regular schedule for at least seven to eight hours a night will do wonders for your outlook when you are awake. 5. Exercise. Regular exercise will help rid your body of that anxious energy and help you get more restful sleep. 6. Try deep (diaphragmatic) breathing. Inhale slowly through your nose for five seconds and exhale through your mouth. 7. Practice acceptance and gratitude. When anxiety hits, accept that there are things out of your control that shouldn't be of immediate concern.  8. Seek out humor. When anxiety strikes, watch a funny video, read jokes or call a friend who makes you laugh. Laughter is healing for our bodies and releases endorphins that are calming. 9. Stay positive. Take the effort to replace negative thoughts with positive ones. Try to see a stressful situation in a positive light. Try to come up with solutions rather than dwelling on the problem. 10. Figure out what triggers your anxiety. Keep a journal and make note of anxious moments and the events surrounding them. This  will help you identify triggers you can avoid or even eliminate. 11. Talk to someone. Let a trusted friend, family member or even trained professional know that you are feeling  overwhelmed and anxious. Verbalize what you are feeling and why.  12. Volunteer. If your anxiety is triggered by a crisis on a large scale, become an advocate and work to resolve the problem that is causing you unease. Anxiety is often unwelcome and can become overwhelming. If not kept in check, it can become a disorder that could require medical treatment. However, if you take the time to care for yourself and avoid the triggers that make you anxious, you will be able to find moments of relaxation and clarity that make your life much more enjoyable.

## 2018-11-03 NOTE — Telephone Encounter (Signed)
[name] - is there any update on the psychiatry referral? Pt has not heard back. Thanks!
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
## 2018-11-04 LAB — CBC WITH DIFFERENTIAL/PLATELET
BASOS PCT: 0.2 %
Basophils Absolute: 17 cells/uL (ref 0–200)
EOS PCT: 0.6 %
Eosinophils Absolute: 50 cells/uL (ref 15–500)
HEMATOCRIT: 39 % (ref 35.0–45.0)
HEMOGLOBIN: 12.7 g/dL (ref 11.7–15.5)
LYMPHS ABS: 1571 {cells}/uL (ref 850–3900)
MCH: 29.3 pg (ref 27.0–33.0)
MCHC: 32.6 g/dL (ref 32.0–36.0)
MCV: 89.9 fL (ref 80.0–100.0)
MONOS PCT: 10.8 %
MPV: 11.4 fL (ref 7.5–12.5)
NEUTROS ABS: 5855 {cells}/uL (ref 1500–7800)
Neutrophils Relative %: 69.7 %
Platelets: 321 10*3/uL (ref 140–400)
RBC: 4.34 10*6/uL (ref 3.80–5.10)
RDW: 12.6 % (ref 11.0–15.0)
Total Lymphocyte: 18.7 %
WBC mixed population: 907 cells/uL (ref 200–950)
WBC: 8.4 10*3/uL (ref 3.8–10.8)

## 2018-11-04 LAB — RPR: RPR Ser Ql: NONREACTIVE

## 2018-11-04 LAB — HIV ANTIBODY (ROUTINE TESTING W REFLEX): HIV: NONREACTIVE

## 2018-11-05 LAB — CERVICOVAGINAL ANCILLARY ONLY
Chlamydia: NEGATIVE
NEISSERIA GONORRHEA: NEGATIVE
TRICH (WINDOWPATH): NEGATIVE

## 2018-12-09 ENCOUNTER — Ambulatory Visit: Payer: Self-pay | Admitting: Family Medicine

## 2019-01-17 ENCOUNTER — Other Ambulatory Visit: Payer: Self-pay

## 2019-01-17 ENCOUNTER — Emergency Department
Admission: EM | Admit: 2019-01-17 | Discharge: 2019-01-17 | Disposition: A | Discharge status: Home / Self Care | Payer: BLUE CROSS/BLUE SHIELD | Attending: Emergency Medicine | Admitting: Emergency Medicine

## 2019-01-17 DIAGNOSIS — M549 Dorsalgia, unspecified: Secondary | ICD-10-CM | POA: Diagnosis present

## 2019-01-17 DIAGNOSIS — Z79899 Other long term (current) drug therapy: Secondary | ICD-10-CM | POA: Diagnosis not present

## 2019-01-17 DIAGNOSIS — J45909 Unspecified asthma, uncomplicated: Secondary | ICD-10-CM | POA: Insufficient documentation

## 2019-01-17 DIAGNOSIS — M62830 Muscle spasm of back: Secondary | ICD-10-CM | POA: Diagnosis not present

## 2019-01-17 MED ORDER — CYCLOBENZAPRINE HCL 5 MG PO TABS: 5.0000 mg | ORAL_TABLET | Freq: Three times a day (TID) | ORAL | 0 refills | Status: DC | PRN

## 2019-01-17 MED ORDER — CYCLOBENZAPRINE HCL 10 MG PO TABS
5.0000 mg | ORAL_TABLET | Freq: Once | ORAL | Status: AC
Start: 2019-01-17 — End: 2019-01-17
  Administered 2019-01-17: 5 mg via ORAL
  Filled 2019-01-17: qty 1

## 2019-01-17 NOTE — ED Notes (Signed)
URINE COLLECTED AND SENT TO LAB IF NEEDED
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
## 2019-01-17 NOTE — ED Triage Notes (Signed)
Pt states has been having "back spasms" today. Pt states extends "entire spine" up to neck. Pt ambulatory without difficulty, denies fever.

## 2019-01-17 NOTE — Discharge Instructions (Signed)
Follow-up with your primary care provider if not improving over the next few days.  Return to the emergency department for symptoms of change or worsen if you are unable to schedule appointment.

## 2019-02-24 ENCOUNTER — Emergency Department
Admission: EM | Admit: 2019-02-24 | Discharge: 2019-02-25 | Disposition: A | Discharge status: Home / Self Care | Payer: BLUE CROSS/BLUE SHIELD | Attending: Emergency Medicine | Admitting: Emergency Medicine

## 2019-02-24 ENCOUNTER — Other Ambulatory Visit: Payer: Self-pay

## 2019-02-24 ENCOUNTER — Encounter: Payer: Self-pay | Admitting: Emergency Medicine

## 2019-02-24 DIAGNOSIS — Z79899 Other long term (current) drug therapy: Secondary | ICD-10-CM | POA: Insufficient documentation

## 2019-02-24 DIAGNOSIS — R197 Diarrhea, unspecified: Secondary | ICD-10-CM | POA: Diagnosis not present

## 2019-02-24 DIAGNOSIS — E860 Dehydration: Secondary | ICD-10-CM

## 2019-02-24 DIAGNOSIS — E876 Hypokalemia: Secondary | ICD-10-CM | POA: Diagnosis not present

## 2019-02-24 DIAGNOSIS — J4520 Mild intermittent asthma, uncomplicated: Secondary | ICD-10-CM | POA: Insufficient documentation

## 2019-02-24 DIAGNOSIS — R112 Nausea with vomiting, unspecified: Secondary | ICD-10-CM | POA: Insufficient documentation

## 2019-02-24 LAB — CBC WITH DIFFERENTIAL/PLATELET
Abs Immature Granulocytes: 0.04 10*3/uL (ref 0.00–0.07)
Basophils Absolute: 0 10*3/uL (ref 0.0–0.1)
Basophils Relative: 0 %
Eosinophils Absolute: 0 10*3/uL (ref 0.0–0.5)
Eosinophils Relative: 0 %
HCT: 42.5 % (ref 36.0–46.0)
Hemoglobin: 13.6 g/dL (ref 12.0–15.0)
IMMATURE GRANULOCYTES: 0 %
Lymphocytes Relative: 9 %
Lymphs Abs: 1 10*3/uL (ref 0.7–4.0)
MCH: 28.9 pg (ref 26.0–34.0)
MCHC: 32 g/dL (ref 30.0–36.0)
MCV: 90.2 fL (ref 80.0–100.0)
Monocytes Absolute: 0.7 10*3/uL (ref 0.1–1.0)
Monocytes Relative: 6 %
NEUTROS PCT: 85 %
Neutro Abs: 9.4 10*3/uL — ABNORMAL HIGH (ref 1.7–7.7)
Platelets: 313 10*3/uL (ref 150–400)
RBC: 4.71 MIL/uL (ref 3.87–5.11)
RDW: 12.7 % (ref 11.5–15.5)
WBC: 11.1 10*3/uL — ABNORMAL HIGH (ref 4.0–10.5)
nRBC: 0 % (ref 0.0–0.2)

## 2019-02-24 LAB — COMPREHENSIVE METABOLIC PANEL
ALT: 16 U/L (ref 0–44)
ANION GAP: 10 (ref 5–15)
AST: 23 U/L (ref 15–41)
Albumin: 4.5 g/dL (ref 3.5–5.0)
Alkaline Phosphatase: 55 U/L (ref 38–126)
BUN: 9 mg/dL (ref 6–20)
CO2: 26 mmol/L (ref 22–32)
Calcium: 8.8 mg/dL — ABNORMAL LOW (ref 8.9–10.3)
Chloride: 106 mmol/L (ref 98–111)
Creatinine, Ser: 0.75 mg/dL (ref 0.44–1.00)
GFR calc Af Amer: >60 mL/min (ref >60)
GFR calc non Af Amer: >60 mL/min (ref >60)
GLUCOSE: 92 mg/dL (ref 70–99)
Potassium: 3.1 mmol/L — ABNORMAL LOW (ref 3.5–5.1)
Sodium: 142 mmol/L (ref 135–145)
Total Bilirubin: 0.8 mg/dL (ref 0.3–1.2)
Total Protein: 7.3 g/dL (ref 6.5–8.1)

## 2019-02-24 LAB — LIPASE, BLOOD: Lipase: 20 U/L (ref 11–51)

## 2019-02-24 MED ORDER — ONDANSETRON HCL 4 MG/2ML IJ SOLN
4.0000 mg | Freq: Once | INTRAMUSCULAR | Status: AC
  Administered 2019-02-24: 4 mg via INTRAVENOUS
  Filled 2019-02-24: qty 2

## 2019-02-24 MED ORDER — SODIUM CHLORIDE 0.9 % IV BOLUS
1000.0000 mL | Freq: Once | INTRAVENOUS | Status: AC
  Administered 2019-02-24: 1000 mL via INTRAVENOUS
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
## 2019-02-24 NOTE — ED Notes (Signed)
Patient c/o N/V/D and lower abdominal cramping beginning at 3:30 this morning. Patient reports stools are liquid. Patient not able to tolerate food or PO fluids.

## 2019-02-24 NOTE — ED Triage Notes (Signed)
Patient ambulatory to triage with steady gait, without difficulty or distress noted; pt st "I think I have food poisoning"; reports N/V/D today with abd cramping

## 2019-02-25 LAB — URINALYSIS, COMPLETE (UACMP) WITH MICROSCOPIC
Bacteria, UA: NONE SEEN
Bilirubin Urine: NEGATIVE
Glucose, UA: NEGATIVE mg/dL
Hgb urine dipstick: NEGATIVE
Ketones, ur: 80 mg/dL — AB
Leukocytes,Ua: NEGATIVE
Nitrite: NEGATIVE
PROTEIN: NEGATIVE mg/dL
Specific Gravity, Urine: 1.027 (ref 1.005–1.030)
pH: 5 (ref 5.0–8.0)

## 2019-02-25 LAB — PREGNANCY, URINE: Preg Test, Ur: NEGATIVE

## 2019-02-25 MED ORDER — ONDANSETRON 4 MG PO TBDP: 4.0000 mg | ORAL_TABLET | Freq: Three times a day (TID) | ORAL | 0 refills | Status: DC | PRN

## 2019-02-25 MED ORDER — POTASSIUM CHLORIDE 20 MEQ PO PACK
40.0000 meq | PACK | Freq: Once | ORAL | Status: AC
  Administered 2019-02-25: 40 meq via ORAL

## 2019-02-25 NOTE — ED Notes (Signed)
Patient given ice chips for PO challenge per patient request and MD Beather Arbour

## 2019-02-25 NOTE — ED Notes (Signed)
Patient asking about discharge. MD informed.

## 2019-02-25 NOTE — Discharge Instructions (Addendum)
1.  You may take Zofran as needed for nausea/vomiting. 2.  Clear liquids x12 hours then BRAT diet x3 days, then slowly advance diet as tolerated. 3.  Return to the ER for worsening symptoms, persistent vomiting, difficulty breathing or other concerns.

## 2019-02-25 NOTE — ED Notes (Signed)
Patient able to tolerate ice chips with no issue. Patient denies current nausea. MD informed.

## 2019-02-25 NOTE — ED Notes (Signed)
Reviewed discharge instructions, follow-up care, and prescriptions with patient. Patient verbalized understanding of all information reviewed. Patient stable, with no distress noted at this time.    

## 2019-03-20 ENCOUNTER — Other Ambulatory Visit (HOSPITAL_COMMUNITY)
Admission: RE | Admit: 2019-03-20 | Discharge: 2019-03-20 | Disposition: A | Discharge status: Home / Self Care | Payer: BLUE CROSS/BLUE SHIELD | Source: Ambulatory Visit | Attending: Family Medicine | Admitting: Family Medicine

## 2019-03-20 ENCOUNTER — Encounter: Payer: Self-pay | Admitting: Family Medicine

## 2019-03-20 ENCOUNTER — Ambulatory Visit: Payer: Self-pay | Admitting: Family Medicine

## 2019-03-20 ENCOUNTER — Other Ambulatory Visit: Payer: Self-pay

## 2019-03-20 VITALS — BP 112/68 | HR 88 | Temp 98.0°F | Resp 16 | Ht 67.0 in | Wt 136.5 lb

## 2019-03-20 DIAGNOSIS — M545 Low back pain, unspecified: Secondary | ICD-10-CM

## 2019-03-20 DIAGNOSIS — Z113 Encounter for screening for infections with a predominantly sexual mode of transmission: Secondary | ICD-10-CM | POA: Diagnosis present

## 2019-03-20 DIAGNOSIS — F39 Unspecified mood [affective] disorder: Secondary | ICD-10-CM

## 2019-03-20 DIAGNOSIS — F339 Major depressive disorder, recurrent, unspecified: Secondary | ICD-10-CM

## 2019-03-20 DIAGNOSIS — F411 Generalized anxiety disorder: Secondary | ICD-10-CM

## 2019-03-20 DIAGNOSIS — I440 Atrioventricular block, first degree: Secondary | ICD-10-CM

## 2019-03-20 DIAGNOSIS — R079 Chest pain, unspecified: Secondary | ICD-10-CM

## 2019-03-20 MED ORDER — HYDROXYZINE HCL 10 MG PO TABS: 10.0000 mg | ORAL_TABLET | Freq: Three times a day (TID) | ORAL | 0 refills | Status: DC | PRN
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
## 2019-03-23 LAB — HIV ANTIBODY (ROUTINE TESTING W REFLEX): HIV 1&2 Ab, 4th Generation: NONREACTIVE

## 2019-03-23 LAB — RPR: RPR Ser Ql: NONREACTIVE

## 2019-03-24 ENCOUNTER — Telehealth: Payer: BLUE CROSS/BLUE SHIELD | Admitting: Nurse Practitioner

## 2019-03-24 ENCOUNTER — Encounter: Payer: Self-pay | Admitting: Family Medicine

## 2019-03-24 DIAGNOSIS — N10 Acute tubulo-interstitial nephritis: Secondary | ICD-10-CM

## 2019-03-24 LAB — CERVICOVAGINAL ANCILLARY ONLY
Bacterial vaginitis: POSITIVE — AB
Candida vaginitis: NEGATIVE
Chlamydia: NEGATIVE
Neisseria Gonorrhea: NEGATIVE
Trichomonas: NEGATIVE

## 2019-03-24 NOTE — Progress Notes (Signed)
Based on what you shared with me it looks like you have UTI or possibly pyelonephritis due to having back pain with this illness,that should be evaluated in a face to face office visit. You will need to see your PCP for a urinalysis and urine culture   NOTE: If you entered your credit card information for this eVisit, you will not be charged. You may see a "hold" on your card for the $30 but that hold will drop off and you will not have a charge processed.  If you are having a true medical emergency please call 911.  If you need an urgent face to face visit, [facility] has four urgent care centers for your convenience.  If you need care fast and have a high deductible or no insurance consider:   [email or web address] to reserve your spot online an avoid wait times  [facility] [address], [address] [place], [place] [address] 8 am to 8 pm Monday-Friday 10 am to 4 pm Saturday-Sunday *Across the street from [employer]  [place], [address] 8 am to 5 pm Monday-Friday * In the [facility] on the [facility]   The following sites will take your  insurance:  . [facility] [place] a Provider at this Location  [address] [place], [place] [address] . 10 am to 8 pm Monday-Friday . 12 pm to 8 pm Saturday-Sunday   . [facility] at [place] a Provider at this Location  [address] [place], [place], [place] [address] . 8 am to 8 pm Monday-Friday . 9 am to 6 pm Saturday . 11 am to 6 pm Sunday   . [facility] at [place] Get Driving Directions  [address] [place], [place] [address] . 8 am to 8 pm Monday-Friday . 8 am to 4 pm Saturday-Sunday   Your e-visit answers were reviewed by a board certified advanced clinical practitioner  to complete your personal care plan.

## 2019-03-25 ENCOUNTER — Other Ambulatory Visit: Payer: Self-pay | Admitting: Family Medicine

## 2019-03-25 MED ORDER — NITROFURANTOIN MONOHYD MACRO 100 MG PO CAPS: 100.0000 mg | ORAL_CAPSULE | Freq: Two times a day (BID) | ORAL | 0 refills | Status: DC

## 2019-03-26 ENCOUNTER — Ambulatory Visit (INDEPENDENT_AMBULATORY_CARE_PROVIDER_SITE_OTHER): Payer: BLUE CROSS/BLUE SHIELD | Admitting: Licensed Clinical Social Worker

## 2019-03-26 ENCOUNTER — Other Ambulatory Visit: Payer: Self-pay

## 2019-03-26 DIAGNOSIS — F4310 Post-traumatic stress disorder, unspecified: Secondary | ICD-10-CM

## 2019-03-26 DIAGNOSIS — F339 Major depressive disorder, recurrent, unspecified: Secondary | ICD-10-CM | POA: Diagnosis not present
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
## 2019-04-08 ENCOUNTER — Other Ambulatory Visit: Payer: Self-pay

## 2019-04-08 ENCOUNTER — Ambulatory Visit (INDEPENDENT_AMBULATORY_CARE_PROVIDER_SITE_OTHER): Payer: BLUE CROSS/BLUE SHIELD | Admitting: Licensed Clinical Social Worker

## 2019-04-08 DIAGNOSIS — F339 Major depressive disorder, recurrent, unspecified: Secondary | ICD-10-CM | POA: Diagnosis not present

## 2019-04-22 ENCOUNTER — Ambulatory Visit (INDEPENDENT_AMBULATORY_CARE_PROVIDER_SITE_OTHER): Payer: BLUE CROSS/BLUE SHIELD | Admitting: Licensed Clinical Social Worker

## 2019-04-22 ENCOUNTER — Other Ambulatory Visit: Payer: Self-pay

## 2019-04-22 DIAGNOSIS — F339 Major depressive disorder, recurrent, unspecified: Secondary | ICD-10-CM

## 2019-04-27 DIAGNOSIS — I440 Atrioventricular block, first degree: Secondary | ICD-10-CM | POA: Insufficient documentation

## 2019-04-29 NOTE — Progress Notes (Signed)
Virtual Visit via Telephone Note  I connected with Kimberly Davidson on 04/29/19 at 10:00 AM EDT by a video enabled telemedicine application and verified that I am speaking with the correct person using two identifiers.  Location: Patient: home Provider: office   I discussed the limitations of evaluation and management by telemedicine and the availability of in person appointments. The patient expressed understanding and agreed to proceed.   Interventions: Supportive  Summary: Kimberly Davidson is a 23 y.o. female who presents with continued symptoms.  Therapist met with Patient in an initial therapy session to assess current mood and to build rapport. Therapist engaged Patient in discussion about her life and what is going well for her. Therapist provided support for Patient as she shared details about her life, her current stressors, mood, coping skills, her past, and school. Therapist prompted Patient to discuss her support system and ways that she manages her daily stress, anger, and frustrations.   LCSW discussed what psychotherapy is and is not and the importance of the therapeutic relationship to include open and honest communication between client and therapist and building trust.  Reviewed advantages and disadvantages of the therapeutic process and limitations to the therapeutic relationship including LCSW's role in maintaining the safety of the client, others and those in client's care.     Suicidal/Homicidal: No  Plan: Return again in 2 weeks.  Diagnosis: Axis I: Generalized Anxiety Disorder    Axis II: No diagnosis    The patient was advised to call back or seek an in-person evaluation if the symptoms worsen or if the condition fails to improve as anticipated.  I provided 30 minutes of non-face-to-face time during this encounter.   Lubertha South, LCSW

## 2019-05-01 ENCOUNTER — Ambulatory Visit (INDEPENDENT_AMBULATORY_CARE_PROVIDER_SITE_OTHER): Payer: BLUE CROSS/BLUE SHIELD | Admitting: Family Medicine

## 2019-05-01 ENCOUNTER — Encounter: Payer: Self-pay | Admitting: Family Medicine

## 2019-05-01 VITALS — BP 102/60 | HR 83 | Temp 97.8°F | Ht 67.0 in | Wt 140.0 lb

## 2019-05-01 DIAGNOSIS — F39 Unspecified mood [affective] disorder: Secondary | ICD-10-CM

## 2019-05-01 DIAGNOSIS — F411 Generalized anxiety disorder: Secondary | ICD-10-CM

## 2019-05-01 DIAGNOSIS — I440 Atrioventricular block, first degree: Secondary | ICD-10-CM | POA: Diagnosis not present

## 2019-05-01 MED ORDER — HYDROXYZINE HCL 10 MG PO TABS: 10.0000 mg | ORAL_TABLET | Freq: Three times a day (TID) | ORAL | 0 refills | Status: DC | PRN
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
## 2019-05-02 ENCOUNTER — Encounter: Payer: Self-pay | Admitting: Family Medicine

## 2019-05-05 ENCOUNTER — Other Ambulatory Visit: Payer: Self-pay | Admitting: Family Medicine

## 2019-05-05 MED ORDER — TRIAMCINOLONE ACETONIDE 0.1 % EX CREA: 1.0000 "application " | TOPICAL_CREAM | Freq: Two times a day (BID) | CUTANEOUS | 0 refills | Status: DC

## 2019-05-06 ENCOUNTER — Ambulatory Visit: Payer: Self-pay | Admitting: Licensed Clinical Social Worker

## 2019-05-06 ENCOUNTER — Other Ambulatory Visit: Payer: Self-pay

## 2019-05-06 DIAGNOSIS — F339 Major depressive disorder, recurrent, unspecified: Secondary | ICD-10-CM

## 2019-05-07 NOTE — Progress Notes (Signed)
Virtual Visit via Video Note  I connected with Kimberly Davidson on 04/22/19 at 10:00 AM EDT by a video enabled telemedicine application and verified that I am speaking with the correct person using two identifiers.  Location: Patient: home Provider: office   I discussed the limitations of evaluation and management by telemedicine and the availability of in person appointments. The patient expressed understanding and agreed to proceed.      Participation Level: Active  Type of Therapy: Individual Therapy  Treatment Goals addressed: Coping  Interventions: CBT and Motivational Interviewing  Summary: Kimberly Davidson is a 23 y.o. female who presents with continued symptoms.  Therapist discussed Practical Facts about Depression with Patient.  Therapist educated Patient on What is Depression and how it is diagnosed.  Reviewed with her Symptoms of depression and her current and past symptoms. Therapist assisted Patient with completing an exercise on "symptoms of depression" in the Illness Management and Recovery workbook.  Discussion of when her symptoms seem to present themselves and the importance of having a journal to record information to learn about triggers.  Therapist normalized Patient's thoughts about depression by discussion famous people who have been diagnosed with depression.  Therapist ended the session with a discussion on stigma.  Therapist encouraged Patient to journal and to praise herself for the positives.    Suicidal/Homicidal: No  Plan: Return again in 2 weeks.  Diagnosis: Axis I: Depression    Axis II: No diagnosis    I discussed the assessment and treatment plan with the patient. The patient was provided an opportunity to ask questions and all were answered. The patient agreed with the plan and demonstrated an understanding of the instructions.   The patient was advised to call back or seek an in-person evaluation if the symptoms worsen or if the condition fails to  improve as anticipated.  I provided 30 minutes of non-face-to-face time during this encounter.   Lubertha South, LCSW

## 2019-05-13 NOTE — Progress Notes (Signed)
No contact.  Left message

## 2019-05-27 ENCOUNTER — Ambulatory Visit: Payer: Self-pay | Admitting: Licensed Clinical Social Worker

## 2019-05-27 ENCOUNTER — Other Ambulatory Visit: Payer: Self-pay

## 2019-06-17 ENCOUNTER — Other Ambulatory Visit: Payer: Self-pay

## 2019-06-17 ENCOUNTER — Ambulatory Visit: Payer: Self-pay | Admitting: Licensed Clinical Social Worker

## 2019-07-15 ENCOUNTER — Other Ambulatory Visit: Payer: Self-pay

## 2019-07-15 ENCOUNTER — Other Ambulatory Visit (HOSPITAL_COMMUNITY): Admission: RE | Admit: 2019-07-15 | Payer: Self-pay | Source: Ambulatory Visit

## 2019-07-15 ENCOUNTER — Ambulatory Visit (INDEPENDENT_AMBULATORY_CARE_PROVIDER_SITE_OTHER): Payer: BLUE CROSS/BLUE SHIELD | Admitting: Family Medicine

## 2019-07-15 ENCOUNTER — Encounter: Payer: Self-pay | Admitting: Family Medicine

## 2019-07-15 DIAGNOSIS — R8290 Unspecified abnormal findings in urine: Secondary | ICD-10-CM

## 2019-07-15 DIAGNOSIS — Z113 Encounter for screening for infections with a predominantly sexual mode of transmission: Secondary | ICD-10-CM

## 2019-07-15 DIAGNOSIS — N898 Other specified noninflammatory disorders of vagina: Secondary | ICD-10-CM

## 2019-07-15 DIAGNOSIS — L239 Allergic contact dermatitis, unspecified cause: Secondary | ICD-10-CM

## 2019-07-15 DIAGNOSIS — J3089 Other allergic rhinitis: Secondary | ICD-10-CM

## 2019-07-15 DIAGNOSIS — J302 Other seasonal allergic rhinitis: Secondary | ICD-10-CM

## 2019-07-15 MED ORDER — PREDNISONE 10 MG PO TABS: ORAL_TABLET | ORAL | 0 refills | Status: DC

## 2019-07-15 MED ORDER — CETIRIZINE HCL 10 MG PO TABS: 10.0000 mg | ORAL_TABLET | Freq: Every day | ORAL | 5 refills | Status: DC
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
## 2019-07-22 LAB — HIV ANTIBODY (ROUTINE TESTING W REFLEX): HIV 1&2 Ab, 4th Generation: NONREACTIVE

## 2019-07-22 LAB — URINE CULTURE
MICRO NUMBER:: 754456
SPECIMEN QUALITY:: ADEQUATE

## 2019-07-22 LAB — RPR: RPR Ser Ql: NONREACTIVE

## 2019-07-23 ENCOUNTER — Encounter: Payer: Self-pay | Admitting: Family Medicine

## 2019-07-23 ENCOUNTER — Other Ambulatory Visit: Payer: Self-pay | Admitting: Family Medicine

## 2019-07-23 DIAGNOSIS — N3000 Acute cystitis without hematuria: Secondary | ICD-10-CM

## 2019-07-23 MED ORDER — NITROFURANTOIN MONOHYD MACRO 100 MG PO CAPS: 100.0000 mg | ORAL_CAPSULE | Freq: Two times a day (BID) | ORAL | 0 refills | Status: AC

## 2019-07-25 LAB — CERVICOVAGINAL ANCILLARY ONLY
Bacterial vaginitis: POSITIVE — AB
Candida vaginitis: NEGATIVE
Chlamydia: NEGATIVE
Neisseria Gonorrhea: NEGATIVE
Trichomonas: NEGATIVE

## 2019-07-26 ENCOUNTER — Other Ambulatory Visit: Payer: Self-pay | Admitting: Family Medicine

## 2019-07-26 DIAGNOSIS — B9689 Other specified bacterial agents as the cause of diseases classified elsewhere: Secondary | ICD-10-CM

## 2019-07-26 DIAGNOSIS — N760 Acute vaginitis: Secondary | ICD-10-CM

## 2019-07-26 MED ORDER — METRONIDAZOLE 500 MG PO TABS: 500.0000 mg | ORAL_TABLET | Freq: Two times a day (BID) | ORAL | 0 refills | Status: DC

## 2019-07-27 ENCOUNTER — Telehealth: Payer: Self-pay | Admitting: Family Medicine

## 2019-07-27 DIAGNOSIS — B9689 Other specified bacterial agents as the cause of diseases classified elsewhere: Secondary | ICD-10-CM

## 2019-07-27 NOTE — Telephone Encounter (Signed)
Medication Refill - Medication: metroNIDAZOLE (FLAGYL) 500 MG tablet [[id number]   Has the patient contacted their pharmacy? No. (Agent: If no, request that the patient contact the pharmacy for the refill.) (Agent: If yes, when and what did the pharmacy advise?)  Preferred Pharmacy (with phone number or street name): [facility] in [place]  [place] Agent: Please be advised that RX refills may take up to 3 business days. We ask that you follow-up with your pharmacy.

## 2019-07-30 MED ORDER — METRONIDAZOLE 500 MG PO TABS: 500.0000 mg | ORAL_TABLET | Freq: Two times a day (BID) | ORAL | 0 refills | Status: AC

## 2019-07-30 NOTE — Telephone Encounter (Signed)
Patient is calling to check the status of her medication.  Pharmacy stated that they still did not have the medication.  Please advise and call patient back to let her know when it would be available.  CB# [phone]

## 2019-07-30 NOTE — Telephone Encounter (Signed)
Attempted to contact pt. At mobile number; unable to reach and vm box is full.  Attempted to call home number @ [phone] vm re: Metronidazole being sent to [facility].  Advised will resend Rx to [facility] in [place], [place], per her request.  Advised to call back if any questions or concerns.

## 2019-08-27 ENCOUNTER — Encounter: Payer: Self-pay | Admitting: Family Medicine

## 2019-08-28 ENCOUNTER — Encounter: Payer: Self-pay | Admitting: Family Medicine

## 2019-09-10 ENCOUNTER — Other Ambulatory Visit: Payer: Self-pay

## 2019-09-10 DIAGNOSIS — Z20822 Contact with and (suspected) exposure to covid-19: Secondary | ICD-10-CM

## 2019-09-11 LAB — NOVEL CORONAVIRUS, NAA: SARS-CoV-2, NAA: DETECTED — AB

## 2019-09-22 ENCOUNTER — Other Ambulatory Visit: Payer: Self-pay

## 2019-09-22 ENCOUNTER — Encounter: Payer: Self-pay | Admitting: Psychiatry

## 2019-09-22 ENCOUNTER — Telehealth: Payer: Self-pay

## 2019-09-22 ENCOUNTER — Ambulatory Visit (INDEPENDENT_AMBULATORY_CARE_PROVIDER_SITE_OTHER): Payer: BLUE CROSS/BLUE SHIELD | Admitting: Psychiatry

## 2019-09-22 DIAGNOSIS — Z79899 Other long term (current) drug therapy: Secondary | ICD-10-CM | POA: Diagnosis not present

## 2019-09-22 DIAGNOSIS — F3181 Bipolar II disorder: Secondary | ICD-10-CM | POA: Diagnosis not present

## 2019-09-22 DIAGNOSIS — F411 Generalized anxiety disorder: Secondary | ICD-10-CM | POA: Diagnosis not present

## 2019-09-22 DIAGNOSIS — F4310 Post-traumatic stress disorder, unspecified: Secondary | ICD-10-CM | POA: Insufficient documentation

## 2019-09-22 MED ORDER — DIVALPROEX SODIUM ER 250 MG PO TB24: 750.0000 mg | ORAL_TABLET | Freq: Every day | ORAL | 1 refills | Status: DC

## 2019-09-22 MED ORDER — HYDROXYZINE HCL 25 MG PO TABS: 25.0000 mg | ORAL_TABLET | Freq: Two times a day (BID) | ORAL | 1 refills | Status: DC | PRN
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
## 2019-09-22 NOTE — Telephone Encounter (Signed)
lab work orders mailed out  

## 2019-10-05 ENCOUNTER — Other Ambulatory Visit: Payer: Self-pay

## 2019-10-05 DIAGNOSIS — Z20822 Contact with and (suspected) exposure to covid-19: Secondary | ICD-10-CM

## 2019-10-06 ENCOUNTER — Ambulatory Visit (INDEPENDENT_AMBULATORY_CARE_PROVIDER_SITE_OTHER): Payer: BLUE CROSS/BLUE SHIELD | Admitting: Psychiatry

## 2019-10-06 ENCOUNTER — Other Ambulatory Visit: Payer: Self-pay

## 2019-10-06 DIAGNOSIS — F3181 Bipolar II disorder: Secondary | ICD-10-CM

## 2019-10-06 DIAGNOSIS — F4310 Post-traumatic stress disorder, unspecified: Secondary | ICD-10-CM

## 2019-10-06 DIAGNOSIS — F411 Generalized anxiety disorder: Secondary | ICD-10-CM

## 2019-10-06 LAB — NOVEL CORONAVIRUS, NAA: SARS-CoV-2, NAA: NOT DETECTED

## 2019-10-06 NOTE — Progress Notes (Signed)
No response to call or text. 

## 2019-10-12 ENCOUNTER — Other Ambulatory Visit: Payer: Self-pay

## 2019-10-12 DIAGNOSIS — Z20822 Contact with and (suspected) exposure to covid-19: Secondary | ICD-10-CM

## 2019-10-13 LAB — NOVEL CORONAVIRUS, NAA: SARS-CoV-2, NAA: NOT DETECTED

## 2019-10-23 ENCOUNTER — Encounter: Payer: Self-pay | Admitting: Family Medicine

## 2019-10-26 ENCOUNTER — Ambulatory Visit (INDEPENDENT_AMBULATORY_CARE_PROVIDER_SITE_OTHER): Payer: BLUE CROSS/BLUE SHIELD | Admitting: Psychiatry

## 2019-10-26 ENCOUNTER — Encounter: Payer: Self-pay | Admitting: Psychiatry

## 2019-10-26 ENCOUNTER — Other Ambulatory Visit: Payer: Self-pay

## 2019-10-26 DIAGNOSIS — Z79899 Other long term (current) drug therapy: Secondary | ICD-10-CM

## 2019-10-26 DIAGNOSIS — F3181 Bipolar II disorder: Secondary | ICD-10-CM

## 2019-10-26 DIAGNOSIS — F411 Generalized anxiety disorder: Secondary | ICD-10-CM | POA: Diagnosis not present

## 2019-10-26 DIAGNOSIS — F4310 Post-traumatic stress disorder, unspecified: Secondary | ICD-10-CM | POA: Diagnosis not present
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
## 2019-10-29 ENCOUNTER — Ambulatory Visit (INDEPENDENT_AMBULATORY_CARE_PROVIDER_SITE_OTHER): Payer: BLUE CROSS/BLUE SHIELD | Admitting: Family Medicine

## 2019-10-29 ENCOUNTER — Other Ambulatory Visit (HOSPITAL_COMMUNITY): Admission: RE | Admit: 2019-10-29 | Payer: BLUE CROSS/BLUE SHIELD | Source: Ambulatory Visit

## 2019-10-29 ENCOUNTER — Encounter: Payer: Self-pay | Admitting: Family Medicine

## 2019-10-29 ENCOUNTER — Other Ambulatory Visit: Payer: Self-pay

## 2019-10-29 VITALS — BP 120/70 | HR 75 | Temp 97.8°F | Resp 18 | Ht 67.0 in | Wt 137.0 lb

## 2019-10-29 DIAGNOSIS — N949 Unspecified condition associated with female genital organs and menstrual cycle: Secondary | ICD-10-CM | POA: Diagnosis not present

## 2019-10-29 DIAGNOSIS — R1031 Right lower quadrant pain: Secondary | ICD-10-CM | POA: Diagnosis not present

## 2019-10-29 DIAGNOSIS — L209 Atopic dermatitis, unspecified: Secondary | ICD-10-CM

## 2019-10-29 DIAGNOSIS — R3915 Urgency of urination: Secondary | ICD-10-CM | POA: Diagnosis not present

## 2019-10-29 DIAGNOSIS — R1032 Left lower quadrant pain: Secondary | ICD-10-CM

## 2019-10-29 MED ORDER — EUCRISA 2 % EX OINT: 1.0000 "application " | TOPICAL_OINTMENT | Freq: Two times a day (BID) | CUTANEOUS | 3 refills | Status: DC

## 2019-10-29 MED ORDER — TRIAMCINOLONE ACETONIDE 0.1 % EX OINT: 1.0000 "application " | TOPICAL_OINTMENT | Freq: Two times a day (BID) | CUTANEOUS | 0 refills | Status: DC
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
## 2019-10-29 NOTE — Patient Instructions (Signed)

## 2019-10-30 ENCOUNTER — Other Ambulatory Visit: Payer: Self-pay | Admitting: Family Medicine

## 2019-10-30 LAB — CERVICOVAGINAL ANCILLARY ONLY
Bacterial Vaginitis (gardnerella): POSITIVE — AB
Candida Glabrata: NEGATIVE
Candida Vaginitis: NEGATIVE
Chlamydia: NEGATIVE
Comment: NEGATIVE
Comment: NEGATIVE
Comment: NEGATIVE
Comment: NEGATIVE
Comment: NEGATIVE
Comment: NORMAL
Neisseria Gonorrhea: NEGATIVE
Trichomonas: NEGATIVE

## 2019-10-31 LAB — URINALYSIS
Bilirubin Urine: NEGATIVE
Glucose, UA: NEGATIVE
Hgb urine dipstick: NEGATIVE
Ketones, ur: NEGATIVE
Nitrite: NEGATIVE
Protein, ur: NEGATIVE
Specific Gravity, Urine: 1.023 (ref 1.001–1.03)
pH: 6.5 (ref 5.0–8.0)

## 2019-10-31 LAB — URINE CULTURE
MICRO NUMBER:: 1120724
SPECIMEN QUALITY:: ADEQUATE

## 2019-11-01 ENCOUNTER — Other Ambulatory Visit: Payer: Self-pay | Admitting: Family Medicine

## 2019-11-01 DIAGNOSIS — B9620 Unspecified Escherichia coli [E. coli] as the cause of diseases classified elsewhere: Secondary | ICD-10-CM

## 2019-11-01 DIAGNOSIS — B9689 Other specified bacterial agents as the cause of diseases classified elsewhere: Secondary | ICD-10-CM

## 2019-11-01 DIAGNOSIS — N390 Urinary tract infection, site not specified: Secondary | ICD-10-CM

## 2019-11-01 MED ORDER — METRONIDAZOLE 0.75 % VA GEL: 1.0000 | VAGINAL | 0 refills | Status: DC

## 2019-11-01 MED ORDER — NITROFURANTOIN MONOHYD MACRO 100 MG PO CAPS: 100.0000 mg | ORAL_CAPSULE | Freq: Two times a day (BID) | ORAL | 0 refills | Status: AC

## 2019-11-02 ENCOUNTER — Telehealth: Payer: Self-pay | Admitting: Family Medicine

## 2019-11-02 DIAGNOSIS — L209 Atopic dermatitis, unspecified: Secondary | ICD-10-CM

## 2019-11-02 MED ORDER — TACROLIMUS 0.03 % EX OINT: TOPICAL_OINTMENT | Freq: Two times a day (BID) | CUTANEOUS | 0 refills | Status: DC

## 2019-11-02 NOTE — Telephone Encounter (Signed)
Patient notified

## 2019-11-02 NOTE — Telephone Encounter (Signed)
Please call to let patient know her [name] was denied - have to trial tacrolimus ointment on her eczema first, which I have sent in.  Try for about a 2-3 weeks, if not improving, let me know.

## 2019-11-03 ENCOUNTER — Ambulatory Visit (INDEPENDENT_AMBULATORY_CARE_PROVIDER_SITE_OTHER): Payer: BLUE CROSS/BLUE SHIELD | Admitting: Licensed Clinical Social Worker

## 2019-11-03 ENCOUNTER — Other Ambulatory Visit: Payer: Self-pay

## 2019-11-03 DIAGNOSIS — F3181 Bipolar II disorder: Secondary | ICD-10-CM | POA: Diagnosis not present

## 2019-11-08 NOTE — Progress Notes (Signed)
   THERAPIST PROGRESS NOTE  Session Time: 53min  Participation Level: Active  Type of Therapy: Individual Therapy  Treatment Goals addressed: Anxiety and Coping  Interventions: CBT and Motivational Interviewing  Summary: Kimberly Davidson is a 23 y.o. female who presents with continued symptoms of diagnosis. Educated [place] on her diagnosis and discussed her symptoms.  Reviewed the current coping strategies that she uses and discussed the effectiveness.  Discussion of risky behavior such as promiscuity.  Educated her on the potential risks and challenged her to use coping skills to reduce. Discussion of positive supportive people that she interacts with and people that she feels safe discussing her symptoms with.   Suicidal/Homicidal: No  Plan: Return again in 2 weeks.  Diagnosis: Axis I: Bipolar, Depressed    Axis II: No diagnosis    Lubertha South, LCSW 11/08/2019

## 2019-11-16 ENCOUNTER — Other Ambulatory Visit: Payer: Self-pay

## 2019-11-16 DIAGNOSIS — Z20822 Contact with and (suspected) exposure to covid-19: Secondary | ICD-10-CM

## 2019-11-17 ENCOUNTER — Other Ambulatory Visit: Payer: Self-pay

## 2019-11-17 ENCOUNTER — Ambulatory Visit (INDEPENDENT_AMBULATORY_CARE_PROVIDER_SITE_OTHER): Payer: BLUE CROSS/BLUE SHIELD | Admitting: Licensed Clinical Social Worker

## 2019-11-17 DIAGNOSIS — F3181 Bipolar II disorder: Secondary | ICD-10-CM

## 2019-11-17 NOTE — Progress Notes (Signed)
Writer left message encouraging contact °

## 2019-11-18 LAB — NOVEL CORONAVIRUS, NAA: SARS-CoV-2, NAA: NOT DETECTED

## 2019-11-20 ENCOUNTER — Ambulatory Visit: Payer: BLUE CROSS/BLUE SHIELD | Admitting: Psychiatry

## 2019-11-26 ENCOUNTER — Other Ambulatory Visit: Payer: Self-pay

## 2019-11-26 ENCOUNTER — Ambulatory Visit: Payer: BLUE CROSS/BLUE SHIELD | Admitting: Licensed Clinical Social Worker

## 2019-11-27 ENCOUNTER — Ambulatory Visit (INDEPENDENT_AMBULATORY_CARE_PROVIDER_SITE_OTHER): Payer: Self-pay | Admitting: Psychiatry

## 2019-11-27 ENCOUNTER — Ambulatory Visit: Payer: BLUE CROSS/BLUE SHIELD | Admitting: Psychiatry

## 2019-11-27 DIAGNOSIS — Z5329 Procedure and treatment not carried out because of patient's decision for other reasons: Secondary | ICD-10-CM

## 2019-11-27 NOTE — Progress Notes (Signed)
No response to call or text. 

## 2020-01-09 ENCOUNTER — Emergency Department: Payer: BLUE CROSS/BLUE SHIELD

## 2020-01-09 ENCOUNTER — Other Ambulatory Visit: Payer: Self-pay

## 2020-01-09 ENCOUNTER — Emergency Department
Admission: EM | Admit: 2020-01-09 | Discharge: 2020-01-09 | Disposition: A | Discharge status: Home / Self Care | Payer: BLUE CROSS/BLUE SHIELD | Attending: Emergency Medicine | Admitting: Emergency Medicine

## 2020-01-09 ENCOUNTER — Encounter: Payer: Self-pay | Admitting: Emergency Medicine

## 2020-01-09 DIAGNOSIS — M545 Low back pain, unspecified: Secondary | ICD-10-CM

## 2020-01-09 DIAGNOSIS — J45909 Unspecified asthma, uncomplicated: Secondary | ICD-10-CM | POA: Insufficient documentation

## 2020-01-09 DIAGNOSIS — Z79899 Other long term (current) drug therapy: Secondary | ICD-10-CM | POA: Insufficient documentation

## 2020-01-09 LAB — POC URINE PREG, ED: Preg Test, Ur: NEGATIVE

## 2020-01-09 MED ORDER — IBUPROFEN 600 MG PO TABS: 600.0000 mg | ORAL_TABLET | Freq: Three times a day (TID) | ORAL | 0 refills | Status: DC | PRN

## 2020-01-09 MED ORDER — IBUPROFEN 600 MG PO TABS
600.0000 mg | ORAL_TABLET | Freq: Once | ORAL | Status: AC
  Administered 2020-01-09: 600 mg via ORAL
  Filled 2020-01-09: qty 1

## 2020-01-09 NOTE — ED Triage Notes (Signed)
First RN Note: Pt presents to ED via POV with c/o lower back pain, pt states she slipped yesterday, did not fall, but c/o increasing pain since she slipped. Pt ambulatory without difficulty at this time, NAD noted upon arrival to ED.
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
## 2020-01-09 NOTE — Discharge Instructions (Addendum)
You were seen today for acute low back pain. Your xray is negative. This is likely just inflammation. I have given you a RX for antiinflammatories to take every 8 hours as needed with food. Avoid other NSAID's OTC. Return for worsening pain, or weakness of the lower extremities.

## 2020-01-09 NOTE — ED Triage Notes (Signed)
Pt arrived via POV with reports of low back pain, pt states she slipped last night and did not fall but felt a pop in her low back, pt states sxs worse today, painful to bend over.   Pt report OTC pain patch applied at this time.

## 2020-01-15 ENCOUNTER — Other Ambulatory Visit: Payer: BLUE CROSS/BLUE SHIELD

## 2020-01-20 ENCOUNTER — Other Ambulatory Visit: Payer: BLUE CROSS/BLUE SHIELD

## 2020-01-21 ENCOUNTER — Other Ambulatory Visit: Payer: BLUE CROSS/BLUE SHIELD

## 2020-01-21 ENCOUNTER — Ambulatory Visit: Payer: BLUE CROSS/BLUE SHIELD | Attending: Internal Medicine

## 2020-01-21 DIAGNOSIS — Z20822 Contact with and (suspected) exposure to covid-19: Secondary | ICD-10-CM

## 2020-01-22 LAB — NOVEL CORONAVIRUS, NAA: SARS-CoV-2, NAA: NOT DETECTED

## 2020-02-01 ENCOUNTER — Ambulatory Visit: Payer: Self-pay | Attending: Internal Medicine

## 2020-02-01 DIAGNOSIS — Z20822 Contact with and (suspected) exposure to covid-19: Secondary | ICD-10-CM | POA: Insufficient documentation

## 2020-02-02 LAB — NOVEL CORONAVIRUS, NAA: SARS-CoV-2, NAA: NOT DETECTED

## 2020-02-02 LAB — SPECIMEN STATUS REPORT

## 2020-05-27 ENCOUNTER — Other Ambulatory Visit: Payer: Self-pay

## 2020-05-27 ENCOUNTER — Ambulatory Visit (INDEPENDENT_AMBULATORY_CARE_PROVIDER_SITE_OTHER): Payer: 59 | Admitting: Family Medicine

## 2020-05-27 ENCOUNTER — Encounter: Payer: Self-pay | Admitting: Family Medicine

## 2020-05-27 ENCOUNTER — Other Ambulatory Visit (HOSPITAL_COMMUNITY)
Admission: RE | Admit: 2020-05-27 | Discharge: 2020-05-27 | Disposition: A | Discharge status: Home / Self Care | Payer: Self-pay | Source: Ambulatory Visit | Attending: Family Medicine | Admitting: Family Medicine

## 2020-05-27 VITALS — BP 108/80 | HR 81 | Temp 97.5°F | Resp 16 | Ht 67.0 in | Wt 136.3 lb

## 2020-05-27 DIAGNOSIS — Z113 Encounter for screening for infections with a predominantly sexual mode of transmission: Secondary | ICD-10-CM | POA: Insufficient documentation

## 2020-05-27 DIAGNOSIS — R3915 Urgency of urination: Secondary | ICD-10-CM

## 2020-05-27 DIAGNOSIS — Z79899 Other long term (current) drug therapy: Secondary | ICD-10-CM

## 2020-05-27 DIAGNOSIS — D500 Iron deficiency anemia secondary to blood loss (chronic): Secondary | ICD-10-CM

## 2020-05-27 DIAGNOSIS — L209 Atopic dermatitis, unspecified: Secondary | ICD-10-CM

## 2020-05-27 DIAGNOSIS — F3181 Bipolar II disorder: Secondary | ICD-10-CM

## 2020-05-27 DIAGNOSIS — Z30011 Encounter for initial prescription of contraceptive pills: Secondary | ICD-10-CM

## 2020-05-27 DIAGNOSIS — L309 Dermatitis, unspecified: Secondary | ICD-10-CM

## 2020-05-27 LAB — POCT URINALYSIS DIPSTICK
Bilirubin, UA: NEGATIVE
Blood, UA: NEGATIVE
Glucose, UA: NEGATIVE
Ketones, UA: NEGATIVE
Leukocytes, UA: NEGATIVE
Nitrite, UA: NEGATIVE
Protein, UA: NEGATIVE
Spec Grav, UA: 1.02 (ref 1.010–1.025)
Urobilinogen, UA: 0.2 E.U./dL
pH, UA: 5 (ref 5.0–8.0)

## 2020-05-27 MED ORDER — NORGESTIMATE-ETH ESTRADIOL 0.25-35 MG-MCG PO TABS: 1.0000 | ORAL_TABLET | Freq: Every day | ORAL | 5 refills | Status: DC

## 2020-05-27 MED ORDER — CIPROFLOXACIN HCL 250 MG PO TABS: 250.0000 mg | ORAL_TABLET | Freq: Two times a day (BID) | ORAL | 0 refills | Status: DC

## 2020-05-27 MED ORDER — DIVALPROEX SODIUM ER 250 MG PO TB24: 750.0000 mg | ORAL_TABLET | Freq: Every day | ORAL | 0 refills | Status: DC

## 2020-05-27 MED ORDER — TRIAMCINOLONE ACETONIDE 0.1 % EX OINT: 1.0000 "application " | TOPICAL_OINTMENT | Freq: Two times a day (BID) | CUTANEOUS | 0 refills | Status: DC

## 2020-05-27 MED ORDER — TACROLIMUS 0.03 % EX OINT: TOPICAL_OINTMENT | Freq: Two times a day (BID) | CUTANEOUS | 0 refills | Status: DC

## 2020-05-27 MED ORDER — FERROUS SULFATE 325 (65 FE) MG PO TBEC: 325.0000 mg | DELAYED_RELEASE_TABLET | Freq: Three times a day (TID) | ORAL | 3 refills | Status: DC
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
## 2020-05-28 LAB — CBC WITH DIFFERENTIAL/PLATELET
Absolute Monocytes: 755 cells/uL (ref 200–950)
Basophils Absolute: 39 cells/uL (ref 0–200)
Basophils Relative: 0.4 %
Eosinophils Absolute: 39 cells/uL (ref 15–500)
Eosinophils Relative: 0.4 %
HCT: 41.2 % (ref 35.0–45.0)
Hemoglobin: 13 g/dL (ref 11.7–15.5)
Lymphs Abs: 1627 cells/uL (ref 850–3900)
MCH: 28.1 pg (ref 27.0–33.0)
MCHC: 31.6 g/dL — ABNORMAL LOW (ref 32.0–36.0)
MCV: 89 fL (ref 80.0–100.0)
MPV: 11.5 fL (ref 7.5–12.5)
Monocytes Relative: 7.7 %
Neutro Abs: 7340 cells/uL (ref 1500–7800)
Neutrophils Relative %: 74.9 %
Platelets: 323 10*3/uL (ref 140–400)
RBC: 4.63 10*6/uL (ref 3.80–5.10)
RDW: 13.5 % (ref 11.0–15.0)
Total Lymphocyte: 16.6 %
WBC: 9.8 10*3/uL (ref 3.8–10.8)

## 2020-05-28 LAB — URINE CULTURE
MICRO NUMBER:: 10609616
Result:: NO GROWTH
SPECIMEN QUALITY:: ADEQUATE

## 2020-05-28 LAB — COMPLETE METABOLIC PANEL WITH GFR
AG Ratio: 2 (calc) (ref 1.0–2.5)
ALT: 13 U/L (ref 6–29)
AST: 17 U/L (ref 10–30)
Albumin: 4.5 g/dL (ref 3.6–5.1)
Alkaline phosphatase (APISO): 67 U/L (ref 31–125)
BUN: 11 mg/dL (ref 7–25)
CO2: 24 mmol/L (ref 20–32)
Calcium: 9.7 mg/dL (ref 8.6–10.2)
Chloride: 103 mmol/L (ref 98–110)
Creat: 0.81 mg/dL (ref 0.50–1.10)
GFR, Est African American: 119 mL/min/{1.73_m2} (ref >=60)
GFR, Est Non African American: 103 mL/min/{1.73_m2} (ref >=60)
Globulin: 2.3 g/dL (calc) (ref 1.9–3.7)
Glucose, Bld: 68 mg/dL (ref 65–99)
Potassium: 4.1 mmol/L (ref 3.5–5.3)
Sodium: 138 mmol/L (ref 135–146)
Total Bilirubin: 0.5 mg/dL (ref 0.2–1.2)
Total Protein: 6.8 g/dL (ref 6.1–8.1)

## 2020-05-28 LAB — IRON,TIBC AND FERRITIN PANEL
%SAT: 11 % (calc) — ABNORMAL LOW (ref 16–45)
Ferritin: 8 ng/mL — ABNORMAL LOW (ref 16–154)
Iron: 44 ug/dL (ref 40–190)
TIBC: 402 mcg/dL (calc) (ref 250–450)

## 2020-05-28 LAB — HIV ANTIBODY (ROUTINE TESTING W REFLEX): HIV 1&2 Ab, 4th Generation: NONREACTIVE

## 2020-05-30 LAB — CERVICOVAGINAL ANCILLARY ONLY
Chlamydia: NEGATIVE
Comment: NEGATIVE
Comment: NORMAL
Neisseria Gonorrhea: NEGATIVE

## 2020-06-02 ENCOUNTER — Other Ambulatory Visit: Payer: Self-pay | Admitting: Family Medicine

## 2020-06-07 ENCOUNTER — Other Ambulatory Visit: Payer: Self-pay | Admitting: Family Medicine

## 2020-06-07 ENCOUNTER — Encounter: Payer: Self-pay | Admitting: Family Medicine

## 2020-06-07 DIAGNOSIS — R300 Dysuria: Secondary | ICD-10-CM

## 2020-06-23 ENCOUNTER — Ambulatory Visit: Payer: Self-pay | Admitting: Urology

## 2020-06-23 ENCOUNTER — Encounter: Payer: Self-pay | Admitting: Urology

## 2020-07-12 ENCOUNTER — Ambulatory Visit (INDEPENDENT_AMBULATORY_CARE_PROVIDER_SITE_OTHER): Payer: 59 | Admitting: Internal Medicine

## 2020-07-12 ENCOUNTER — Encounter: Payer: Self-pay | Admitting: Internal Medicine

## 2020-07-12 ENCOUNTER — Other Ambulatory Visit: Payer: Self-pay

## 2020-07-12 VITALS — BP 102/72 | HR 76 | Temp 98.1°F | Resp 16 | Ht 67.0 in | Wt 129.3 lb

## 2020-07-12 DIAGNOSIS — R319 Hematuria, unspecified: Secondary | ICD-10-CM | POA: Diagnosis not present

## 2020-07-12 LAB — POCT URINALYSIS DIPSTICK
Appearance: NORMAL
Bilirubin, UA: NEGATIVE
Blood, UA: NEGATIVE
Glucose, UA: NEGATIVE
Ketones, UA: NEGATIVE
Leukocytes, UA: NEGATIVE
Nitrite, UA: NEGATIVE
Odor: NORMAL
Protein, UA: NEGATIVE
Spec Grav, UA: 1.02 (ref 1.010–1.025)
Urobilinogen, UA: 0.2 E.U./dL
pH, UA: 6 (ref 5.0–8.0)
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
## 2020-07-12 NOTE — Addendum Note (Signed)
Addended by: Lennie Muckle on: 07/12/2020 03:00 PM   Modules accepted: Orders

## 2020-07-28 ENCOUNTER — Other Ambulatory Visit: Payer: Self-pay

## 2020-07-28 ENCOUNTER — Ambulatory Visit
Admission: EM | Admit: 2020-07-28 | Discharge: 2020-07-28 | Disposition: A | Discharge status: Home / Self Care | Payer: 59 | Attending: Emergency Medicine | Admitting: Emergency Medicine

## 2020-07-28 DIAGNOSIS — H1033 Unspecified acute conjunctivitis, bilateral: Secondary | ICD-10-CM | POA: Diagnosis not present

## 2020-07-28 MED ORDER — POLYMYXIN B-TRIMETHOPRIM 10000-0.1 UNIT/ML-% OP SOLN: 1.0000 [drp] | Freq: Four times a day (QID) | OPHTHALMIC | 0 refills | Status: AC

## 2020-07-28 NOTE — ED Triage Notes (Signed)
Patient complains of bilateral eye pain and redness. States that she is sensitive to light and unable to open them with bright lights. Patient states that she feels like there may be something in her eyes.
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
## 2020-07-28 NOTE — Discharge Instructions (Addendum)
Use the antibiotic eyedrops as prescribed.    Follow-up with your eye doctor for a recheck in 1 to 2 days if your symptoms are not improving.    Go to the emergency department if you have acute eye pain, changes in your vision, or other concerning symptoms.    

## 2020-08-17 ENCOUNTER — Other Ambulatory Visit: Payer: Self-pay

## 2020-08-17 ENCOUNTER — Telehealth: Payer: Self-pay | Admitting: Family Medicine

## 2020-08-17 ENCOUNTER — Other Ambulatory Visit: Payer: Self-pay | Admitting: Family Medicine

## 2020-08-17 MED ORDER — VALACYCLOVIR HCL 1 G PO TABS: 1000.0000 mg | ORAL_TABLET | Freq: Two times a day (BID) | ORAL | 0 refills | Status: DC

## 2020-08-17 NOTE — Telephone Encounter (Signed)
Patient called to request the doctor send in a script for a medication she use to take for cold sore outbreak.  She does not remember the name of medication but she stated she use to take it.  Please advise and call patient to discuss at [phone]
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
## 2020-09-30 ENCOUNTER — Encounter: Payer: 59 | Admitting: Family Medicine
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
## 2020-10-25 ENCOUNTER — Other Ambulatory Visit (HOSPITAL_COMMUNITY)
Admission: RE | Admit: 2020-10-25 | Discharge: 2020-10-25 | Disposition: A | Discharge status: Home / Self Care | Payer: 59 | Source: Ambulatory Visit | Attending: Family Medicine | Admitting: Family Medicine

## 2020-10-25 ENCOUNTER — Other Ambulatory Visit: Payer: Self-pay

## 2020-10-25 ENCOUNTER — Encounter: Payer: Self-pay | Admitting: Family Medicine

## 2020-10-25 ENCOUNTER — Ambulatory Visit (INDEPENDENT_AMBULATORY_CARE_PROVIDER_SITE_OTHER): Payer: 59 | Admitting: Family Medicine

## 2020-10-25 VITALS — BP 120/70 | HR 90 | Temp 98.5°F | Resp 17 | Ht 67.0 in | Wt 134.8 lb

## 2020-10-25 DIAGNOSIS — Z113 Encounter for screening for infections with a predominantly sexual mode of transmission: Secondary | ICD-10-CM | POA: Diagnosis not present

## 2020-10-25 DIAGNOSIS — H938X1 Other specified disorders of right ear: Secondary | ICD-10-CM

## 2020-10-25 DIAGNOSIS — J302 Other seasonal allergic rhinitis: Secondary | ICD-10-CM

## 2020-10-25 DIAGNOSIS — R102 Pelvic and perineal pain: Secondary | ICD-10-CM | POA: Diagnosis not present

## 2020-10-25 DIAGNOSIS — J029 Acute pharyngitis, unspecified: Secondary | ICD-10-CM

## 2020-10-25 DIAGNOSIS — Z23 Encounter for immunization: Secondary | ICD-10-CM

## 2020-10-25 DIAGNOSIS — F3181 Bipolar II disorder: Secondary | ICD-10-CM | POA: Diagnosis not present

## 2020-10-25 DIAGNOSIS — F411 Generalized anxiety disorder: Secondary | ICD-10-CM | POA: Diagnosis not present

## 2020-10-25 DIAGNOSIS — G43009 Migraine without aura, not intractable, without status migrainosus: Secondary | ICD-10-CM

## 2020-10-25 DIAGNOSIS — Z79899 Other long term (current) drug therapy: Secondary | ICD-10-CM

## 2020-10-25 DIAGNOSIS — J3089 Other allergic rhinitis: Secondary | ICD-10-CM

## 2020-10-25 DIAGNOSIS — L209 Atopic dermatitis, unspecified: Secondary | ICD-10-CM

## 2020-10-25 MED ORDER — DIVALPROEX SODIUM ER 250 MG PO TB24: 750.0000 mg | ORAL_TABLET | Freq: Every day | ORAL | 5 refills | Status: DC

## 2020-10-25 MED ORDER — SUMATRIPTAN SUCCINATE 50 MG PO TABS: 50.0000 mg | ORAL_TABLET | Freq: Every day | ORAL | 2 refills | Status: DC | PRN

## 2020-10-26 LAB — COMPLETE METABOLIC PANEL WITH GFR
AG Ratio: 1.7 (calc) (ref 1.0–2.5)
ALT: 12 U/L (ref 6–29)
AST: 18 U/L (ref 10–30)
Albumin: 4.4 g/dL (ref 3.6–5.1)
Alkaline phosphatase (APISO): 67 U/L (ref 31–125)
BUN: 10 mg/dL (ref 7–25)
CO2: 24 mmol/L (ref 20–32)
Calcium: 10.1 mg/dL (ref 8.6–10.2)
Chloride: 110 mmol/L (ref 98–110)
Creat: 0.89 mg/dL (ref 0.50–1.10)
GFR, Est African American: 106 mL/min/{1.73_m2} (ref >=60)
GFR, Est Non African American: 91 mL/min/{1.73_m2} (ref >=60)
Globulin: 2.6 g/dL (calc) (ref 1.9–3.7)
Glucose, Bld: 93 mg/dL (ref 65–99)
Potassium: 5.4 mmol/L — ABNORMAL HIGH (ref 3.5–5.3)
Sodium: 143 mmol/L (ref 135–146)
Total Bilirubin: 0.3 mg/dL (ref 0.2–1.2)
Total Protein: 7 g/dL (ref 6.1–8.1)

## 2020-10-26 LAB — CBC WITH DIFFERENTIAL/PLATELET
Absolute Monocytes: 598 cells/uL (ref 200–950)
Basophils Absolute: 18 cells/uL (ref 0–200)
Basophils Relative: 0.2 %
Eosinophils Absolute: 53 cells/uL (ref 15–500)
Eosinophils Relative: 0.6 %
HCT: 40.7 % (ref 35.0–45.0)
Hemoglobin: 13 g/dL (ref 11.7–15.5)
Lymphs Abs: 1874 cells/uL (ref 850–3900)
MCH: 28.3 pg (ref 27.0–33.0)
MCHC: 31.9 g/dL — ABNORMAL LOW (ref 32.0–36.0)
MCV: 88.7 fL (ref 80.0–100.0)
MPV: 12.4 fL (ref 7.5–12.5)
Monocytes Relative: 6.8 %
Neutro Abs: 6257 cells/uL (ref 1500–7800)
Neutrophils Relative %: 71.1 %
Platelets: 335 10*3/uL (ref 140–400)
RBC: 4.59 10*6/uL (ref 3.80–5.10)
RDW: 12.6 % (ref 11.0–15.0)
Total Lymphocyte: 21.3 %
WBC: 8.8 10*3/uL (ref 3.8–10.8)

## 2020-10-26 LAB — CERVICOVAGINAL ANCILLARY ONLY
Chlamydia: NEGATIVE
Comment: NEGATIVE
Comment: NEGATIVE
Comment: NORMAL
Neisseria Gonorrhea: NEGATIVE
Trichomonas: NEGATIVE

## 2020-10-26 LAB — RPR: RPR Ser Ql: NONREACTIVE

## 2020-10-26 LAB — HIV ANTIBODY (ROUTINE TESTING W REFLEX): HIV 1&2 Ab, 4th Generation: NONREACTIVE
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
## 2020-11-25 ENCOUNTER — Ambulatory Visit: Payer: 59 | Admitting: Family Medicine

## 2021-01-25 ENCOUNTER — Other Ambulatory Visit: Payer: Self-pay | Admitting: Family Medicine

## 2021-01-25 DIAGNOSIS — L309 Dermatitis, unspecified: Secondary | ICD-10-CM

## 2021-01-25 MED ORDER — TRIAMCINOLONE ACETONIDE 0.1 % EX OINT: 1.0000 "application " | TOPICAL_OINTMENT | Freq: Two times a day (BID) | CUTANEOUS | 0 refills | Status: DC

## 2021-01-25 MED ORDER — VALACYCLOVIR HCL 1 G PO TABS: 1000.0000 mg | ORAL_TABLET | Freq: Two times a day (BID) | ORAL | 0 refills | Status: DC

## 2021-01-26 ENCOUNTER — Telehealth: Payer: Self-pay | Admitting: Family Medicine

## 2021-01-26 NOTE — Telephone Encounter (Signed)
Spoke with Danae Chen and gave her directions

## 2021-01-26 NOTE — Telephone Encounter (Signed)
Ethel with [facility] is calling and needs to know where the patient needs to apply triamcinolone ointment and generic valtrex please clarify the directions one tab twice a day or take 2 tabs on the first day of outbreak only
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
## 2021-01-27 ENCOUNTER — Other Ambulatory Visit: Payer: Self-pay

## 2021-01-27 ENCOUNTER — Other Ambulatory Visit (HOSPITAL_COMMUNITY)
Admission: RE | Admit: 2021-01-27 | Discharge: 2021-01-27 | Disposition: A | Discharge status: Home / Self Care | Payer: 59 | Source: Ambulatory Visit | Attending: Family Medicine | Admitting: Family Medicine

## 2021-01-27 ENCOUNTER — Encounter: Payer: Self-pay | Admitting: Family Medicine

## 2021-01-27 ENCOUNTER — Ambulatory Visit (INDEPENDENT_AMBULATORY_CARE_PROVIDER_SITE_OTHER): Payer: 59 | Admitting: Family Medicine

## 2021-01-27 ENCOUNTER — Telehealth: Payer: Self-pay | Admitting: Family Medicine

## 2021-01-27 VITALS — BP 120/68 | HR 78 | Temp 98.5°F | Resp 16 | Ht 67.0 in | Wt 137.1 lb

## 2021-01-27 DIAGNOSIS — L309 Dermatitis, unspecified: Secondary | ICD-10-CM

## 2021-01-27 DIAGNOSIS — L308 Other specified dermatitis: Secondary | ICD-10-CM | POA: Diagnosis not present

## 2021-01-27 DIAGNOSIS — Z113 Encounter for screening for infections with a predominantly sexual mode of transmission: Secondary | ICD-10-CM | POA: Diagnosis not present

## 2021-01-27 DIAGNOSIS — R5383 Other fatigue: Secondary | ICD-10-CM

## 2021-01-27 MED ORDER — HYDROXYZINE HCL 10 MG PO TABS: 10.0000 mg | ORAL_TABLET | Freq: Three times a day (TID) | ORAL | 0 refills | Status: DC | PRN

## 2021-01-27 MED ORDER — PIMECROLIMUS 1 % EX CREA: TOPICAL_CREAM | Freq: Two times a day (BID) | CUTANEOUS | 0 refills | Status: DC

## 2021-01-27 NOTE — Telephone Encounter (Signed)
Antony Madura, from [facility], calling and is needing to have clarification on the medication Elidel. She states that they are needing to know where this is being applied. Please advise.       [place] [place] [address]  [place] [address]  Phone: [phone] Fax: [phone]  Hours: Not open 24 hours

## 2021-01-27 NOTE — Telephone Encounter (Signed)
Called pharmacy and clarified

## 2021-01-30 ENCOUNTER — Other Ambulatory Visit: Payer: Self-pay | Admitting: Family Medicine

## 2021-01-30 LAB — CBC WITH DIFFERENTIAL/PLATELET
Absolute Monocytes: 782 cells/uL (ref 200–950)
Basophils Absolute: 30 cells/uL (ref 0–200)
Basophils Relative: 0.3 %
Eosinophils Absolute: 99 cells/uL (ref 15–500)
Eosinophils Relative: 1 %
HCT: 38.7 % (ref 35.0–45.0)
Hemoglobin: 13 g/dL (ref 11.7–15.5)
Lymphs Abs: 2138 cells/uL (ref 850–3900)
MCH: 30.2 pg (ref 27.0–33.0)
MCHC: 33.6 g/dL (ref 32.0–36.0)
MCV: 89.8 fL (ref 80.0–100.0)
MPV: 11.8 fL (ref 7.5–12.5)
Monocytes Relative: 7.9 %
Neutro Abs: 6851 cells/uL (ref 1500–7800)
Neutrophils Relative %: 69.2 %
Platelets: 355 10*3/uL (ref 140–400)
RBC: 4.31 10*6/uL (ref 3.80–5.10)
RDW: 13 % (ref 11.0–15.0)
Total Lymphocyte: 21.6 %
WBC: 9.9 10*3/uL (ref 3.8–10.8)

## 2021-01-30 LAB — CERVICOVAGINAL ANCILLARY ONLY
Chlamydia: NEGATIVE
Comment: NEGATIVE
Comment: NORMAL
Neisseria Gonorrhea: NEGATIVE

## 2021-01-30 LAB — COMPLETE METABOLIC PANEL WITH GFR
AG Ratio: 1.6 (calc) (ref 1.0–2.5)
ALT: 11 U/L (ref 6–29)
AST: 17 U/L (ref 10–30)
Albumin: 4.1 g/dL (ref 3.6–5.1)
Alkaline phosphatase (APISO): 76 U/L (ref 31–125)
BUN: 8 mg/dL (ref 7–25)
CO2: 24 mmol/L (ref 20–32)
Calcium: 9 mg/dL (ref 8.6–10.2)
Chloride: 110 mmol/L (ref 98–110)
Creat: 0.74 mg/dL (ref 0.50–1.10)
GFR, Est African American: 132 mL/min/{1.73_m2} (ref >=60)
GFR, Est Non African American: 114 mL/min/{1.73_m2} (ref >=60)
Globulin: 2.5 g/dL (calc) (ref 1.9–3.7)
Glucose, Bld: 72 mg/dL (ref 65–99)
Potassium: 4.4 mmol/L (ref 3.5–5.3)
Sodium: 142 mmol/L (ref 135–146)
Total Bilirubin: 0.2 mg/dL (ref 0.2–1.2)
Total Protein: 6.6 g/dL (ref 6.1–8.1)

## 2021-01-30 LAB — RPR: RPR Ser Ql: NONREACTIVE

## 2021-01-30 LAB — VITAMIN D 25 HYDROXY (VIT D DEFICIENCY, FRACTURES): Vit D, 25-Hydroxy: 14 ng/mL — ABNORMAL LOW (ref 30–100)

## 2021-01-30 LAB — HIV ANTIBODY (ROUTINE TESTING W REFLEX): HIV 1&2 Ab, 4th Generation: NONREACTIVE

## 2021-01-30 LAB — VITAMIN B12: Vitamin B-12: 480 pg/mL (ref 200–1100)

## 2021-01-30 LAB — TSH: TSH: 1.84 mIU/L

## 2021-01-30 MED ORDER — VITAMIN D (ERGOCALCIFEROL) 1.25 MG (50000 UNIT) PO CAPS: 50000.0000 [IU] | ORAL_CAPSULE | ORAL | 1 refills | Status: DC
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
## 2021-02-20 ENCOUNTER — Encounter: Payer: Self-pay | Admitting: Family Medicine

## 2021-02-20 ENCOUNTER — Ambulatory Visit (INDEPENDENT_AMBULATORY_CARE_PROVIDER_SITE_OTHER): Payer: 59 | Admitting: Family Medicine

## 2021-02-20 ENCOUNTER — Other Ambulatory Visit (HOSPITAL_COMMUNITY)
Admission: RE | Admit: 2021-02-20 | Discharge: 2021-02-20 | Disposition: A | Discharge status: Home / Self Care | Payer: 59 | Source: Ambulatory Visit | Attending: Family Medicine | Admitting: Family Medicine

## 2021-02-20 ENCOUNTER — Other Ambulatory Visit: Payer: Self-pay

## 2021-02-20 VITALS — BP 110/74 | HR 87 | Temp 98.2°F | Resp 16 | Ht 67.0 in | Wt 133.0 lb

## 2021-02-20 DIAGNOSIS — Z3009 Encounter for other general counseling and advice on contraception: Secondary | ICD-10-CM | POA: Diagnosis not present

## 2021-02-20 DIAGNOSIS — Z0000 Encounter for general adult medical examination without abnormal findings: Secondary | ICD-10-CM | POA: Diagnosis not present

## 2021-02-20 DIAGNOSIS — Z124 Encounter for screening for malignant neoplasm of cervix: Secondary | ICD-10-CM | POA: Diagnosis not present

## 2021-02-21 LAB — CYTOLOGY - PAP
Adequacy: ABSENT
Diagnosis: NEGATIVE

## 2021-02-22 ENCOUNTER — Encounter: Payer: Self-pay | Admitting: Family Medicine

## 2021-02-24 ENCOUNTER — Telehealth: Payer: Self-pay

## 2021-02-24 NOTE — Telephone Encounter (Signed)
[facility] referring for Encounter for counseling regarding contraception interested in IUD.Voicemail box not set up unable to leave message for patient to call back to be scheduled.

## 2021-03-02 NOTE — Telephone Encounter (Signed)
Voicemail box not set up unable to leave message for patient to call back to be scheduled.

## 2021-03-07 NOTE — Telephone Encounter (Signed)
Voicemail box not set up unable to leave message for patient to call back to be scheduled.

## 2021-05-01 ENCOUNTER — Other Ambulatory Visit: Payer: Self-pay

## 2021-05-01 ENCOUNTER — Emergency Department
Admission: EM | Admit: 2021-05-01 | Discharge: 2021-05-01 | Disposition: A | Discharge status: Home / Self Care | Payer: 59 | Attending: Emergency Medicine | Admitting: Emergency Medicine

## 2021-05-01 ENCOUNTER — Encounter: Payer: Self-pay | Admitting: Radiology

## 2021-05-01 ENCOUNTER — Emergency Department: Payer: 59

## 2021-05-01 DIAGNOSIS — J45909 Unspecified asthma, uncomplicated: Secondary | ICD-10-CM | POA: Insufficient documentation

## 2021-05-01 DIAGNOSIS — S060X0A Concussion without loss of consciousness, initial encounter: Secondary | ICD-10-CM | POA: Insufficient documentation

## 2021-05-01 DIAGNOSIS — Y92410 Unspecified street and highway as the place of occurrence of the external cause: Secondary | ICD-10-CM | POA: Insufficient documentation

## 2021-05-01 DIAGNOSIS — S161XXA Strain of muscle, fascia and tendon at neck level, initial encounter: Secondary | ICD-10-CM | POA: Diagnosis not present

## 2021-05-01 DIAGNOSIS — S199XXA Unspecified injury of neck, initial encounter: Secondary | ICD-10-CM | POA: Diagnosis present

## 2021-05-01 DIAGNOSIS — M549 Dorsalgia, unspecified: Secondary | ICD-10-CM | POA: Insufficient documentation

## 2021-05-01 MED ORDER — CYCLOBENZAPRINE HCL 10 MG PO TABS: 10.0000 mg | ORAL_TABLET | Freq: Three times a day (TID) | ORAL | 0 refills | Status: DC | PRN

## 2021-05-01 MED ORDER — IBUPROFEN 600 MG PO TABS
600.0000 mg | ORAL_TABLET | Freq: Once | ORAL | Status: AC
  Administered 2021-05-01: 600 mg via ORAL
  Filled 2021-05-01: qty 1

## 2021-05-01 MED ORDER — IBUPROFEN 600 MG PO TABS: 600.0000 mg | ORAL_TABLET | Freq: Four times a day (QID) | ORAL | 0 refills | Status: DC | PRN
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
## 2021-05-01 NOTE — Discharge Instructions (Signed)
Return to the ER for new, worsening, or persistent severe pain, weakness or numbness, severe headache, vomiting, or any other new or worsening symptoms that concern you.  Take the ibuprofen as prescribed every 6 hours, and the Flexeril only if needed for muscle tightness.
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
## 2021-05-01 NOTE — ED Notes (Signed)
Pt has been in xray for several minutes, will administer advil after return

## 2021-05-04 ENCOUNTER — Ambulatory Visit: Payer: Self-pay | Admitting: *Deleted

## 2021-05-04 NOTE — Telephone Encounter (Signed)
Pt reports vaginal discharge x 2 days. States "Greenish one day, now mostly yellowish." States foul odor at times. Mild abdominal cramping at times.No rash.  States Dx with BV last visit but asymptomatic so did not treat. Pt states she would like to have "All tests run again just to be sure." States including STDs. Assured pt NT would route to practice for PCPs review and final disposition. Last screening 01/27/21  Reason for Disposition . Bad smelling vaginal discharge  Answer Assessment - Initial Assessment Questions 1. DISCHARGE: "Describe the discharge." (e.g., white, yellow, green, gray, foamy, cottage cheese-like)     Greenish just one day, now yellowish 2. ODOR: "Is there a bad odor?"     At times 3. ONSET: "When did the discharge begin?"     2 days ago 4. RASH: "Is there a rash in that area?" If Yes, ask: "Describe it." (e.g., redness, blisters, sores, bumps)     no 5. ABDOMINAL PAIN: "Are you having any abdominal pain?" If Yes, ask: "What does it feel like? " (e.g., crampy, dull, intermittent, constant)      "Mild cramping at times." 6. ABDOMINAL PAIN SEVERITY: If present, ask: "How bad is it?"  (e.g., mild, moderate, severe)  - MILD - doesn't interfere with normal activities   - MODERATE - interferes with normal activities or awakens from sleep   - SEVERE - patient doesn't want to move (R/O peritonitis)       7. CAUSE: "What do you think is causing the discharge?" "Have you had the same problem before? What happened then?"     BV but no symptoms then did not treat 8. OTHER SYMPTOMS: "Do you have any other symptoms?" (e.g., fever, itching, vaginal bleeding, pain with urination, injury to genital area, vaginal foreign body)     no  Protocols used: VAGINAL DISCHARGE-A-AH

## 2021-05-09 ENCOUNTER — Other Ambulatory Visit: Payer: Self-pay

## 2021-05-09 ENCOUNTER — Other Ambulatory Visit (HOSPITAL_COMMUNITY)
Admission: RE | Admit: 2021-05-09 | Discharge: 2021-05-09 | Disposition: A | Discharge status: Home / Self Care | Payer: 59 | Source: Ambulatory Visit | Attending: Family Medicine | Admitting: Family Medicine

## 2021-05-09 ENCOUNTER — Ambulatory Visit (INDEPENDENT_AMBULATORY_CARE_PROVIDER_SITE_OTHER): Payer: 59 | Admitting: Family Medicine

## 2021-05-09 ENCOUNTER — Encounter: Payer: Self-pay | Admitting: Family Medicine

## 2021-05-09 VITALS — BP 112/72 | HR 96 | Temp 98.4°F | Resp 16 | Ht 67.0 in | Wt 136.0 lb

## 2021-05-09 DIAGNOSIS — N898 Other specified noninflammatory disorders of vagina: Secondary | ICD-10-CM | POA: Diagnosis not present

## 2021-05-09 DIAGNOSIS — R102 Pelvic and perineal pain: Secondary | ICD-10-CM | POA: Diagnosis not present

## 2021-05-09 DIAGNOSIS — Z113 Encounter for screening for infections with a predominantly sexual mode of transmission: Secondary | ICD-10-CM | POA: Diagnosis not present

## 2021-05-09 MED ORDER — CEFTRIAXONE SODIUM 1 G IJ SOLR: 1.0000 g | Freq: Once | INTRAMUSCULAR | Status: DC

## 2021-05-09 MED ORDER — AZITHROMYCIN 500 MG PO TABS: 1000.0000 mg | ORAL_TABLET | Freq: Every day | ORAL | 0 refills | Status: DC

## 2021-05-09 MED ORDER — CEFTRIAXONE SODIUM 500 MG IJ SOLR
500.0000 mg | Freq: Once | INTRAMUSCULAR | Status: AC
  Administered 2021-05-09: 500 mg via INTRAMUSCULAR

## 2021-05-09 NOTE — Addendum Note (Signed)
Addended by: Carlene Coria on: 05/09/2021 01:31 PM   Modules accepted: Orders
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
## 2021-05-11 LAB — CERVICOVAGINAL ANCILLARY ONLY
Bacterial Vaginitis (gardnerella): POSITIVE — AB
Candida Glabrata: NEGATIVE
Candida Vaginitis: POSITIVE — AB
Chlamydia: NEGATIVE
Comment: NEGATIVE
Comment: NEGATIVE
Comment: NEGATIVE
Comment: NEGATIVE
Comment: NEGATIVE
Comment: NORMAL
Neisseria Gonorrhea: NEGATIVE
Trichomonas: NEGATIVE

## 2021-05-16 ENCOUNTER — Ambulatory Visit: Payer: 59 | Admitting: Family Medicine

## 2021-06-13 ENCOUNTER — Emergency Department (HOSPITAL_COMMUNITY): Payer: 59

## 2021-06-13 ENCOUNTER — Other Ambulatory Visit: Payer: Self-pay

## 2021-06-13 ENCOUNTER — Encounter (HOSPITAL_COMMUNITY): Payer: Self-pay | Admitting: *Deleted

## 2021-06-13 ENCOUNTER — Emergency Department (HOSPITAL_COMMUNITY)
Admission: EM | Admit: 2021-06-13 | Discharge: 2021-06-13 | Disposition: A | Discharge status: Home / Self Care | Payer: 59 | Attending: Emergency Medicine | Admitting: Emergency Medicine

## 2021-06-13 DIAGNOSIS — Z7952 Long term (current) use of systemic steroids: Secondary | ICD-10-CM | POA: Diagnosis not present

## 2021-06-13 DIAGNOSIS — J45909 Unspecified asthma, uncomplicated: Secondary | ICD-10-CM | POA: Diagnosis not present

## 2021-06-13 DIAGNOSIS — X58XXXA Exposure to other specified factors, initial encounter: Secondary | ICD-10-CM | POA: Insufficient documentation

## 2021-06-13 DIAGNOSIS — S93402A Sprain of unspecified ligament of left ankle, initial encounter: Secondary | ICD-10-CM

## 2021-06-13 DIAGNOSIS — S99912A Unspecified injury of left ankle, initial encounter: Secondary | ICD-10-CM | POA: Insufficient documentation

## 2021-06-13 MED ORDER — OXYCODONE-ACETAMINOPHEN 5-325 MG PO TABS
1.0000 | ORAL_TABLET | Freq: Once | ORAL | Status: AC
  Administered 2021-06-13: 1 via ORAL
  Filled 2021-06-13: qty 1

## 2021-06-13 NOTE — ED Triage Notes (Addendum)
Pt reports left foot pain since yesterday. + alcohol use and unsure of any injury.

## 2021-06-13 NOTE — ED Provider Notes (Signed)
Emergency Medicine Provider Triage Evaluation Note  Kimberly Davidson , a 25 y.o. female  was evaluated in triage.  Pt complains of left foot pain since yesterday.  States she was drinking alcohol, unsure whether she had a fall.  There is pain to the dorsum aspect, exacerbated with weightbearing, flexion.  Has not taken any medication for improvement in symptoms, no prior surgical intervention to her left foot.  Review of Systems  Positive: Foot pain Negative: fever  Physical Exam  BP 114/80 (BP Location: Right Arm)   Pulse (!) 102   Temp 98.5 F (36.9 C) (Oral)   Resp 18   SpO2 99%  Gen:   Awake, no distress   Resp:  Normal effort  MSK:   Moves extremities without difficulty  Other:  Moves left foot minimally, pulses are present, sensation is intact throughout.  Arrived in wheelchair.  Some tenderness to palpation along the lateral and medial malleolus.  Medical Decision Making  Medically screening exam initiated at 2:57 PM.  Appropriate orders placed.  Kimberly Davidson was informed that the remainder of the evaluation will be completed by another provider, this initial triage assessment does not replace that evaluation, and the importance of remaining in the ED until their evaluation is complete.     Janeece Fitting, PA-C 06/13/21 1500    Lucrezia Starch, MD 06/14/21 1525

## 2021-06-13 NOTE — Discharge Instructions (Addendum)
Use Tylenol or Motrin as directed for pain

## 2021-06-13 NOTE — ED Notes (Signed)
ASO applied and instructed on how to use; pt verbalized understanding of how to use crutches
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
## 2021-07-13 ENCOUNTER — Ambulatory Visit: Payer: 59 | Admitting: Family Medicine

## 2021-07-24 ENCOUNTER — Other Ambulatory Visit (HOSPITAL_COMMUNITY)
Admission: RE | Admit: 2021-07-24 | Discharge: 2021-07-24 | Disposition: A | Discharge status: Home / Self Care | Payer: 59 | Source: Ambulatory Visit | Attending: Family Medicine | Admitting: Family Medicine

## 2021-07-24 ENCOUNTER — Encounter: Payer: Self-pay | Admitting: Family Medicine

## 2021-07-24 ENCOUNTER — Other Ambulatory Visit: Payer: Self-pay

## 2021-07-24 ENCOUNTER — Telehealth: Payer: Self-pay

## 2021-07-24 ENCOUNTER — Other Ambulatory Visit: Payer: Self-pay | Admitting: Family Medicine

## 2021-07-24 ENCOUNTER — Ambulatory Visit (INDEPENDENT_AMBULATORY_CARE_PROVIDER_SITE_OTHER): Payer: 59 | Admitting: Family Medicine

## 2021-07-24 VITALS — BP 120/76 | HR 97 | Temp 98.7°F | Resp 16 | Ht 67.0 in | Wt 132.0 lb

## 2021-07-24 DIAGNOSIS — J3089 Other allergic rhinitis: Secondary | ICD-10-CM

## 2021-07-24 DIAGNOSIS — Z113 Encounter for screening for infections with a predominantly sexual mode of transmission: Secondary | ICD-10-CM

## 2021-07-24 DIAGNOSIS — F3181 Bipolar II disorder: Secondary | ICD-10-CM | POA: Diagnosis not present

## 2021-07-24 DIAGNOSIS — J302 Other seasonal allergic rhinitis: Secondary | ICD-10-CM

## 2021-07-24 DIAGNOSIS — G43009 Migraine without aura, not intractable, without status migrainosus: Secondary | ICD-10-CM

## 2021-07-24 DIAGNOSIS — B001 Herpesviral vesicular dermatitis: Secondary | ICD-10-CM

## 2021-07-24 DIAGNOSIS — L309 Dermatitis, unspecified: Secondary | ICD-10-CM

## 2021-07-24 DIAGNOSIS — E559 Vitamin D deficiency, unspecified: Secondary | ICD-10-CM

## 2021-07-24 DIAGNOSIS — L308 Other specified dermatitis: Secondary | ICD-10-CM | POA: Diagnosis not present

## 2021-07-24 DIAGNOSIS — L239 Allergic contact dermatitis, unspecified cause: Secondary | ICD-10-CM

## 2021-07-24 MED ORDER — CETIRIZINE HCL 10 MG PO TABS: 10.0000 mg | ORAL_TABLET | Freq: Every day | ORAL | 1 refills | Status: DC

## 2021-07-24 MED ORDER — FLUTICASONE PROPIONATE 50 MCG/ACT NA SUSP: NASAL | 0 refills | Status: DC

## 2021-07-24 MED ORDER — VALACYCLOVIR HCL 1 G PO TABS: 1000.0000 mg | ORAL_TABLET | Freq: Two times a day (BID) | ORAL | 0 refills | Status: DC

## 2021-07-24 MED ORDER — SUMATRIPTAN SUCCINATE 50 MG PO TABS: 50.0000 mg | ORAL_TABLET | Freq: Every day | ORAL | 2 refills | Status: DC | PRN

## 2021-07-24 MED ORDER — VITAMIN D (ERGOCALCIFEROL) 1.25 MG (50000 UNIT) PO CAPS: 50000.0000 [IU] | ORAL_CAPSULE | ORAL | 1 refills | Status: DC

## 2021-07-24 MED ORDER — TACROLIMUS 0.1 % EX OINT: TOPICAL_OINTMENT | Freq: Two times a day (BID) | CUTANEOUS | 0 refills | Status: AC

## 2021-07-24 MED ORDER — DESONIDE 0.05 % EX LOTN: TOPICAL_LOTION | Freq: Two times a day (BID) | CUTANEOUS | 0 refills | Status: DC

## 2021-07-24 MED ORDER — HYDROXYZINE HCL 10 MG PO TABS: 10.0000 mg | ORAL_TABLET | Freq: Three times a day (TID) | ORAL | 0 refills | Status: AC | PRN

## 2021-07-24 NOTE — Telephone Encounter (Signed)
Copied from [place] [id number]. Topic: Quick Communication - Rx Refill/Question >> Jul 24, 2021  2:33 PM Loma Boston wrote: tacrolimus (PROTOPIC) 0.1 % ointment Medication Date: 07/24/2021 Department: [facility] Ordering/Authorizing: Steele Sizer, MD  desonide (DESOWEN) 0.05 % lotion 59 mL 0 07/24/2021   Sig - Route: Apply topically 2 (two) times daily. - Topical  Sent to pharmacy as: desonide (DESOWEN) 0.05 % lotion  E-Prescribing Status: Receipt confirmed by pharmacy (07/24/2021 1:51 PM EDT)  [facility], [place], [place] [address] [place] [address] Phone: [phone] Fax: [phone]  Please fu with Pharmacy as ??? On how to apply
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
## 2021-07-25 LAB — HIV ANTIBODY (ROUTINE TESTING W REFLEX): HIV 1&2 Ab, 4th Generation: NONREACTIVE

## 2021-07-25 LAB — RPR: RPR Ser Ql: NONREACTIVE

## 2021-07-25 NOTE — Telephone Encounter (Signed)
Called pharmacy and clarified

## 2021-07-26 ENCOUNTER — Ambulatory Visit: Payer: 59 | Admitting: Family Medicine

## 2021-07-26 ENCOUNTER — Encounter: Payer: Self-pay | Admitting: Family Medicine

## 2021-07-26 LAB — CERVICOVAGINAL ANCILLARY ONLY
Bacterial Vaginitis (gardnerella): POSITIVE — AB
Candida Glabrata: NEGATIVE
Candida Vaginitis: POSITIVE — AB
Chlamydia: NEGATIVE
Comment: NEGATIVE
Comment: NEGATIVE
Comment: NEGATIVE
Comment: NEGATIVE
Comment: NEGATIVE
Comment: NORMAL
Neisseria Gonorrhea: NEGATIVE
Trichomonas: NEGATIVE

## 2021-07-31 ENCOUNTER — Other Ambulatory Visit: Payer: Self-pay | Admitting: Family Medicine

## 2021-07-31 MED ORDER — METRONIDAZOLE 500 MG PO TABS: 500.0000 mg | ORAL_TABLET | Freq: Two times a day (BID) | ORAL | 0 refills | Status: AC

## 2021-08-03 ENCOUNTER — Ambulatory Visit (INDEPENDENT_AMBULATORY_CARE_PROVIDER_SITE_OTHER): Payer: 59 | Admitting: Dermatology

## 2021-08-03 ENCOUNTER — Encounter: Payer: Self-pay | Admitting: Dermatology

## 2021-08-03 ENCOUNTER — Other Ambulatory Visit: Payer: Self-pay

## 2021-08-03 DIAGNOSIS — L280 Lichen simplex chronicus: Secondary | ICD-10-CM

## 2021-08-03 DIAGNOSIS — L2081 Atopic neurodermatitis: Secondary | ICD-10-CM

## 2021-08-03 DIAGNOSIS — L819 Disorder of pigmentation, unspecified: Secondary | ICD-10-CM

## 2021-08-03 MED ORDER — EUCRISA 2 % EX OINT: 1.0000 "application " | TOPICAL_OINTMENT | Freq: Two times a day (BID) | CUTANEOUS | 4 refills | Status: DC

## 2021-08-03 NOTE — Progress Notes (Signed)
   New Patient Visit  Subjective  Kimberly Davidson is a 25 y.o. female who presents for the following: Eczema (Face, body, yrs, itchy, burns, use Elidel in past, TMC 0.1% oint and protopic for body, for face desonide lotion, just started desonide ~1wk ago). New patient referral from Dr. Steele Sizer.  The following portions of the chart were reviewed this encounter and updated as appropriate:   Tobacco  Allergies  Meds  Problems  Med Hx  Surg Hx  Fam Hx     Review of Systems:  No other skin or systemic complaints except as noted in HPI or Assessment and Plan.  Objective  Well appearing patient in no apparent distress; mood and affect are within normal limits.  A focused examination was performed including face, neck, arms, legs, hands. Relevant physical exam findings are noted in the Assessment and Plan.  face, neck, arms, legs Patchy dispigmentation upper lip, anticubitals, neck, hands with lichenification              Assessment & Plan  Atopic neurodermatitis face, neck, arms, legs With Dyschromia and Lichenification  Atopic dermatitis (eczema) is a chronic, relapsing, pruritic condition that can significantly affect quality of life. It is often associated with allergic rhinitis and/or asthma and can require treatment with topical medications, phototherapy, or in severe cases a biologic medication called Dupixent in children and adults.    Start Eucrisa oint bid to aa eczema on face and body Cont TMC 0.1% cr qd up to 5 days a week to more severe areas of eczema on body, avoid face, groin, axilla Cont Desowen lotion qd up to 5 days a week to more severe areas on face prn flares Start Cln wash qd to body and face  Discussed Opzelura, Dupixent, Adbry, Rinvoq, Cibinqo  Crisaborole (EUCRISA) 2 % OINT - face, neck, arms, legs Apply 1 application topically 2 (two) times daily. Bid to aa eczema on face and body until clear, then prn flares  Return in about 6 weeks  (around 09/14/2021) for Atopic Derm.  I, Othelia Pulling, RMA, am acting as scribe for Sarina Ser, MD . Documentation: I have reviewed the above documentation for accuracy and completeness, and I agree with the above.  Sarina Ser, MD

## 2021-08-03 NOTE — Patient Instructions (Addendum)
If you have any questions or concerns for your doctor, please call our main line at [phone] and press option 4 to reach your doctor's medical assistant. If no one answers, please leave a voicemail as directed and we will return your call as soon as possible. Messages left after 4 pm will be answered the following business day.   You may also send [place] a message via [place]. We typically respond to MyChart messages within 1-2 business days.  For prescription refills, please ask your pharmacy to contact our office. Our fax number is [phone].  If you have an urgent issue when the clinic is closed that cannot wait until the next business day, you can page your doctor at the number below.    Please note that while we do our best to be available for urgent issues outside of office hours, we are not available 24/7.   If you have an urgent issue and are unable to reach [place], you may choose to seek medical care at your doctor's office, retail clinic, urgent care center, or emergency room.  If you have a medical emergency, please immediately call 911 or go to the emergency department.  Pager Numbers  - Dr. Nehemiah Massed: [phone]  - Dr. Laurence Ferrari: [phone]  - Dr. Nicole Kindred: [phone]  In the event of inclement weather, please call our main line at [phone] for an update on the status of any delays or closures.  Dermatology Medication Tips: Please keep the boxes that topical medications come in in order to help keep track of the instructions about where and how to use these. Pharmacies typically print the medication instructions only on the boxes and not directly on the medication tubes.   If your medication is too expensive, please contact our office at [phone] option 4 or send [place] a message through [place].   We are unable to tell what your co-pay for medications will be in advance as this is different depending on your insurance coverage. However, we may be able to find a substitute  medication at lower cost or fill out paperwork to get insurance to cover a needed medication.   If a prior authorization is required to get your medication covered by your insurance company, please allow [place] 1-2 business days to complete this process.  Drug prices often vary depending on where the prescription is filled and some pharmacies may offer cheaper prices.  The website www.goodrx.com contains coupons for medications through different pharmacies. The prices here do not account for what the cost may be with help from insurance (it may be cheaper with your insurance), but the website can give you the price if you did not use any insurance.  - You can print the associated coupon and take it with your prescription to the pharmacy.  - You may also stop by our office during regular business hours and pick up a GoodRx coupon card.  - If you need your prescription sent electronically to a different pharmacy, notify our office through [facility] or by phone at [phone] option 4.   Start Eucrisa oint 2 times a Training and development officer to affected areas of eczema on face and body Cont TMC 0.1% ointment daily up to 5 days a week to more severe areas of eczema on body, avoid face, groin, axilla Cont Desowen lotion daily up to 5 days a week to more severe areas on face as needed for flares Start Cln wash daily to wasy body

## 2021-08-09 ENCOUNTER — Telehealth: Payer: Self-pay

## 2021-08-09 DIAGNOSIS — L2081 Atopic neurodermatitis: Secondary | ICD-10-CM

## 2021-08-09 NOTE — Telephone Encounter (Signed)
Kimberly Davidson is not on pts insurance formulary.What do you want to switch to?

## 2021-08-10 MED ORDER — OPZELURA 1.5 % EX CREA: 1.0000 "application " | TOPICAL_CREAM | Freq: Two times a day (BID) | CUTANEOUS | 2 refills | Status: DC

## 2021-08-10 NOTE — Telephone Encounter (Signed)
Left vm to pt advising of the switch and new pharmacy.

## 2021-08-10 NOTE — Addendum Note (Signed)
Addended by: Harriett Sine on: 08/10/2021 05:45 PM   Modules accepted: Orders

## 2021-08-16 ENCOUNTER — Ambulatory Visit: Payer: 59 | Admitting: Family Medicine

## 2021-09-14 ENCOUNTER — Telehealth: Payer: Self-pay

## 2021-09-14 NOTE — Telephone Encounter (Signed)
Patient called regarding medications.  She was unable to afford Opzelura from [place] with non covered price, along with [place]. She did t/f Tacrolimus from PCP so I did not know if you wanted to send in Pimecrolimus to see if we can get this for patient at affordable cost?  Patient also states Desonide Lotion not covered but her insurance states they will cover Hydrocortisone 2.5% cream as replacement.  Please advise.

## 2021-09-19 MED ORDER — HYDROCORTISONE 2.5 % EX CREA: TOPICAL_CREAM | CUTANEOUS | 2 refills | Status: DC

## 2021-09-19 MED ORDER — PIMECROLIMUS 1 % EX CREA: TOPICAL_CREAM | Freq: Two times a day (BID) | CUTANEOUS | 2 refills | Status: DC

## 2021-09-19 NOTE — Telephone Encounter (Signed)
Patient advised of medication change.

## 2021-09-19 NOTE — Telephone Encounter (Signed)
Prescription sent in and left message for patient to return my call.

## 2021-09-21 ENCOUNTER — Ambulatory Visit: Payer: 59 | Admitting: Dermatology

## 2021-10-31 ENCOUNTER — Ambulatory Visit: Payer: 59 | Admitting: Dermatology

## 2021-10-31 ENCOUNTER — Other Ambulatory Visit: Payer: Self-pay

## 2021-10-31 DIAGNOSIS — L2081 Atopic neurodermatitis: Secondary | ICD-10-CM

## 2021-10-31 DIAGNOSIS — L819 Disorder of pigmentation, unspecified: Secondary | ICD-10-CM | POA: Diagnosis not present

## 2021-10-31 DIAGNOSIS — L280 Lichen simplex chronicus: Secondary | ICD-10-CM | POA: Diagnosis not present

## 2021-10-31 MED ORDER — MOMETASONE FUROATE 0.1 % EX CREA
1.0000 | TOPICAL_CREAM | CUTANEOUS | 2 refills | Status: DC
Start: 2021-10-31 — End: 2022-07-24

## 2021-10-31 MED ORDER — DUPIXENT 300 MG/2ML ~~LOC~~ SOAJ
300.0000 mg | SUBCUTANEOUS | 5 refills | Status: DC
Start: 2021-10-31 — End: 2022-06-06

## 2021-10-31 MED ORDER — DUPIXENT 300 MG/2ML ~~LOC~~ SOAJ: 600.0000 mg | Freq: Once | SUBCUTANEOUS | 0 refills | Status: AC

## 2021-10-31 MED ORDER — HYDROCORTISONE 2.5 % EX CREA: TOPICAL_CREAM | CUTANEOUS | 3 refills | Status: AC

## 2021-10-31 NOTE — Progress Notes (Signed)
Follow-Up Visit   Subjective  Kimberly Davidson is a 25 y.o. female who presents for the following: Eczema (With dyschromia and lichenification, Face, neck, arms, legs, HC 2.5% cr bid, CLN body wash, improving on face, hands arms and legs worse, hx of asthma and seasonal allergies, has used in past Elidel, TMC 0.1% cream, HC 2.5% oint, HC 2.5% cr).   The following portions of the chart were reviewed this encounter and updated as appropriate:       Review of Systems:  No other skin or systemic complaints except as noted in HPI or Assessment and Plan.  Objective  Well appearing patient in no apparent distress; mood and affect are within normal limits.  A focused examination was performed including face, arms, neck, r leg. Relevant physical exam findings are noted in the Assessment and Plan.  face, arms, hands, legs, neck Pink brown patch L malar check, lichenified hyperpigmented patches upper lip, hands, dorsum wrist, bil antecubitums, R popliteal, R neck   Assessment & Plan  Atopic neurodermatitis face, arms, hands, legs, neck  With Dyschromia and Lichenification, not responding to topical treatment  Atopic dermatitis (eczema) is a chronic, relapsing, pruritic condition that can significantly affect quality of life. It is often associated with allergic rhinitis and/or asthma and can require treatment with topical medications, phototherapy, or in severe cases biologic injectable medication (Dupixent; Adbry) or Oral JAK inhibitors.   Recommend pt starting Dupixent, will start pending insurance approval [place] my way forms sent in.  Start Eucrisa oint qd/bid aa eczema face/body samples x 2 given, Lot TACB 11/2023 (Rx was denied by insurance) Cont HC 2.5% cr qd/bid aa face until clear Start Mometasone cream qd/bid aa eczema arms, wrist, legs until clear, then prn flares Cont CLN body wash qd Recommend moisturizer qd/bid  Dupilumab (Dupixent) is a treatment given by injection for  adults and children with moderate-to-severe atopic dermatitis. Goal is control of skin condition, not cure. It is given as 2 injections at the first dose followed by 1 injection ever 2 weeks thereafter.  Young children are dosed monthly.  Potential side effects include allergic reaction, herpes infections, injection site reactions and conjunctivitis (inflammation of the eyes).  The use of Dupixent requires long term medication management, including periodic office visits.   Topical steroids (such as triamcinolone, fluocinolone, fluocinonide, mometasone, clobetasol, halobetasol, betamethasone, hydrocortisone) can cause thinning and lightening of the skin if they are used for too long in the same area. Your physician has selected the right strength medicine for your problem and area affected on the body. Please use your medication only as directed by your physician to prevent side effects.    mometasone (ELOCON) 0.1 % cream - face, arms, hands, legs, neck Apply 1 application topically as directed. Qd to bid to thicker areas of eczema on arms, wrist, legs until clear, then prn flares  hydrocortisone 2.5 % cream - face, arms, hands, legs, neck Apply topically as directed. Qd to bid up to 5 days a week to aa eczema on face until clear then prn flares  Dupilumab (DUPIXENT) 300 MG/2ML SOPN - face, arms, hands, legs, neck Inject 600 mg into the skin once for 1 dose. On day 1.  Dupilumab (DUPIXENT) 300 MG/2ML SOPN - face, arms, hands, legs, neck Inject 300 mg into the skin every 14 (fourteen) days. Starting at day 15 for maintenance.  Return in about 6 weeks (around 12/12/2021) for Atopic Derm.  I, Othelia Pulling, RMA, am acting as scribe for  Brendolyn Patty, MD .  Documentation: I have reviewed the above documentation for accuracy and completeness, and I agree with the above.  Brendolyn Patty MD

## 2021-10-31 NOTE — Patient Instructions (Signed)

## 2021-12-12 ENCOUNTER — Ambulatory Visit: Payer: 59 | Admitting: Dermatology

## 2022-01-01 ENCOUNTER — Telehealth: Payer: Self-pay

## 2022-01-01 NOTE — Telephone Encounter (Signed)
Copied from [place] [id number]. Topic: General - Inquiry >> Jan 01, 2022  1:07 PM Scherrie Gerlach wrote: Reason for CRM: pt is trying to go back to school and needs her immunization record faxed to [facility]  contact number: [phone] They will not accept a screen shot from her mychart.

## 2022-01-02 NOTE — Telephone Encounter (Signed)
Faxed patient's immunization records as requested to [facility] via fax [phone] on 01/02/2022 @ 7:54 AM.

## 2022-06-06 ENCOUNTER — Other Ambulatory Visit: Payer: Self-pay

## 2022-06-06 DIAGNOSIS — L2081 Atopic neurodermatitis: Secondary | ICD-10-CM

## 2022-06-06 MED ORDER — DUPIXENT 300 MG/2ML ~~LOC~~ SOAJ: 300.0000 mg | SUBCUTANEOUS | 0 refills | Status: DC

## 2022-06-06 NOTE — Progress Notes (Signed)
Patient [place] been [place] of [place] during [place] for follow up. Patient called to schedule 1 RF of Dupixent and needing a PA on medication. Advised she needs to be seen every 6 months to continue RFs. Patient will be back in town in August. RX sent in to help keep her covered and PA completed. aw

## 2022-07-24 ENCOUNTER — Ambulatory Visit: Payer: 59 | Admitting: Dermatology

## 2022-07-24 DIAGNOSIS — L280 Lichen simplex chronicus: Secondary | ICD-10-CM

## 2022-07-24 DIAGNOSIS — L819 Disorder of pigmentation, unspecified: Secondary | ICD-10-CM

## 2022-07-24 DIAGNOSIS — Z79899 Other long term (current) drug therapy: Secondary | ICD-10-CM | POA: Diagnosis not present

## 2022-07-24 DIAGNOSIS — L2081 Atopic neurodermatitis: Secondary | ICD-10-CM

## 2022-07-24 MED ORDER — DUPIXENT 300 MG/2ML ~~LOC~~ SOAJ: 300.0000 mg | SUBCUTANEOUS | 5 refills | Status: DC

## 2022-07-24 MED ORDER — OPZELURA 1.5 % EX CREA: TOPICAL_CREAM | CUTANEOUS | 3 refills | Status: DC

## 2022-07-24 MED ORDER — MOMETASONE FUROATE 0.1 % EX CREA: 1.0000 | TOPICAL_CREAM | CUTANEOUS | 2 refills | Status: AC

## 2022-07-24 NOTE — Progress Notes (Signed)
Follow-Up Visit   Subjective  Kimberly Davidson is a 26 y.o. female who presents for the following: Follow-up.  Patient here for follow-up Atopic Neurodermatitis. She is much improved with Dupixent injections and topicals HC 2.5% cream and mometasone cream. No side effects from the [place]. She has occasional flares of the face, but body normally stays clear.  She has had some pretty bad flares around her eyes.  She also has seasonal allergies. Pt has tried and failed Elidel and tacrolimus ointment, Eucrisa was not covered by insurance.  The following portions of the chart were reviewed this encounter and updated as appropriate:       Review of Systems:  No other skin or systemic complaints except as noted in HPI or Assessment and Plan.  Objective  Well appearing patient in no apparent distress; mood and affect are within normal limits.  A focused examination was performed including face, arms, legs. Relevant physical exam findings are noted in the Assessment and Plan.  face, arms, legs, trunk Violaceous discoloration with mild erythema of the BL infraocular area; indistinct hypopigmented macules face; mild lichenification and erythema on bilateral antecubitum.     Assessment & Plan  Atopic neurodermatitis face, arms, legs, trunk   With Dyschromia and Lichenification.  Chronic and persistent condition with duration or expected duration over one year. Condition is symptomatic / bothersome to patient. Much improved, but not to goal.   Atopic dermatitis - Severe, on Dupixent (biologic medication).  Atopic dermatitis (eczema) is a chronic, relapsing, pruritic condition that can significantly affect quality of life. It is often associated with allergic rhinitis and/or asthma and can require treatment with topical medications, phototherapy, or in severe cases a biologic medication called Dupixent, which requires long term medication management.   Cont Dupixent 300 MG/2MLs into the skin  q 2 weeks 5Rf.  Start Opzelura Cream Apply to face/body QD/BID dsp 60g 3Rf. Including periocular area  Cont Mometasone cream qd/bid aa eczema arms, wrist, legs until clear, then prn flares  Dupilumab (Dupixent) is a treatment given by injection for adults and children with moderate-to-severe atopic dermatitis. Goal is control of skin condition, not cure. It is given as 2 injections at the first dose followed by 1 injection ever 2 weeks thereafter.  Young children are dosed monthly.  Potential side effects include allergic reaction, herpes infections, injection site reactions and conjunctivitis (inflammation of the eyes).  The use of Dupixent requires long term medication management, including periodic office visits.  Topical steroids (such as triamcinolone, fluocinolone, fluocinonide, mometasone, clobetasol, halobetasol, betamethasone, hydrocortisone) can cause thinning and lightening of the skin if they are used for too long in the same area. Your physician has selected the right strength medicine for your problem and area affected on the body. Please use your medication only as directed by your physician to prevent side effects.     Ruxolitinib Phosphate (OPZELURA) 1.5 % CREA - face, arms, legs, trunk Apply to affected areas face/body once to twice daily until improved.  Related Medications hydrocortisone 2.5 % cream Apply topically as directed. Qd to bid up to 5 days a week to aa eczema on face until clear then prn flares  Dupilumab (DUPIXENT) 300 MG/2ML SOPN Inject 300 mg into the skin every 14 (fourteen) days. Starting at day 15 for maintenance.  mometasone (ELOCON) 0.1 % cream Apply 1 Application topically as directed. Qd to bid to thicker areas of eczema on arms, wrist, legs until clear, then prn flares   Return in  about 6 months (around 01/24/2023) for Atopic Dermatitis.  IJamesetta Orleans, CMA, am acting as scribe for Brendolyn Patty, MD .  Documentation: I have reviewed the above  documentation for accuracy and completeness, and I agree with the above.  Brendolyn Patty MD

## 2022-07-24 NOTE — Patient Instructions (Addendum)
Dupilumab (Dupixent) is a treatment given by injection for adults and children with moderate-to-severe atopic dermatitis. Goal is control of skin condition, not cure. It is given as 2 injections at the first dose followed by 1 injection ever 2 weeks thereafter.  Young children are dosed monthly.  Potential side effects include allergic reaction, herpes infections, injection site reactions and conjunctivitis (inflammation of the eyes).  The use of Dupixent requires long term medication management, including periodic office visits.    Topical steroids (such as triamcinolone, fluocinolone, fluocinonide, mometasone, clobetasol, halobetasol, betamethasone, hydrocortisone) can cause thinning and lightening of the skin if they are used for too long in the same area. Your physician has selected the right strength medicine for your problem and area affected on the body. Please use your medication only as directed by your physician to prevent side effects.     Due to recent changes in healthcare laws, you may see results of your pathology and/or laboratory studies on MyChart before the doctors have had a chance to review them. We understand that in some cases there may be results that are confusing or concerning to you. Please understand that not all results are received at the same time and often the doctors may need to interpret multiple results in order to provide you with the best plan of care or course of treatment. Therefore, we ask that you please give us 2 business days to thoroughly review all your results before contacting the office for clarification. Should we see a critical lab result, you will be contacted sooner.   If You Need Anything After Your Visit  If you have any questions or concerns for your doctor, please call our main line at [phone] and press option 4 to reach your doctor's medical assistant. If no one answers, please leave a voicemail as directed and we will return your call as  soon as possible. Messages left after 4 pm will be answered the following business day.   You may also send us a message via MyChart. We typically respond to MyChart messages within 1-2 business days.  For prescription refills, please ask your pharmacy to contact our office. Our fax number is [phone].  If you have an urgent issue when the clinic is closed that cannot wait until the next business day, you can page your doctor at the number below.    Please note that while we do our best to be available for urgent issues outside of office hours, we are not available 24/7.   If you have an urgent issue and are unable to reach us, you may choose to seek medical care at your doctor's office, retail clinic, urgent care center, or emergency room.  If you have a medical emergency, please immediately call 911 or go to the emergency department.  Pager Numbers  - Dr. Kowalski: [phone]  - Dr. Moye: [phone]  - Dr. Stewart: [phone]  In the event of inclement weather, please call our main line at [phone] for an update on the status of any delays or closures.  Dermatology Medication Tips: Please keep the boxes that topical medications come in in order to help keep track of the instructions about where and how to use these. Pharmacies typically print the medication instructions only on the boxes and not directly on the medication tubes.   If your medication is too expensive, please contact our office at [phone] option 4 or send us a message through MyChart.   We are unable to tell what   your co-pay for medications will be in advance as this is different depending on your insurance coverage. However, we may be able to find a substitute medication at lower cost or fill out paperwork to get insurance to cover a needed medication.   If a prior authorization is required to get your medication covered by your insurance company, please allow us 1-2 business days to complete this  process.  Drug prices often vary depending on where the prescription is filled and some pharmacies may offer cheaper prices.  The website www.goodrx.com contains coupons for medications through different pharmacies. The prices here do not account for what the cost may be with help from insurance (it may be cheaper with your insurance), but the website can give you the price if you did not use any insurance.  - You can print the associated coupon and take it with your prescription to the pharmacy.  - You may also stop by our office during regular business hours and pick up a GoodRx coupon card.  - If you need your prescription sent electronically to a different pharmacy, notify our office through [facility] MyChart or by phone at [phone] option 4.     Si Usted Necesita Algo Despus de Su Visita  Tambin puede enviarnos un mensaje a travs de MyChart. Por lo general respondemos a los mensajes de MyChart en el transcurso de 1 a 2 das hbiles.  Para renovar recetas, por favor pida a su farmacia que se ponga en contacto con nuestra oficina. Nuestro nmero de fax es el [phone].  Si tiene un asunto urgente cuando la clnica est cerrada y que no puede esperar hasta el siguiente da hbil, puede llamar/localizar a su doctor(a) al nmero que aparece a continuacin.   Por favor, tenga en cuenta que aunque hacemos todo lo posible para estar disponibles para asuntos urgentes fuera del horario de oficina, no estamos disponibles las 24 horas del da, los 7 das de la semana.   Si tiene un problema urgente y no puede comunicarse con nosotros, puede optar por buscar atencin mdica  en el consultorio de su doctor(a), en una clnica privada, en un centro de atencin urgente o en una sala de emergencias.  Si tiene una emergencia mdica, por favor llame inmediatamente al 911 o vaya a la sala de emergencias.  Nmeros de bper  - Dr. Kowalski: [phone]  - Dra. Moye: [phone]  - Dra.  Stewart: [phone]  En caso de inclemencias del tiempo, por favor llame a nuestra lnea principal al [phone] para una actualizacin sobre el estado de cualquier retraso o cierre.  Consejos para la medicacin en dermatologa: Por favor, guarde las cajas en las que vienen los medicamentos de uso tpico para ayudarle a seguir las instrucciones sobre dnde y cmo usarlos. Las farmacias generalmente imprimen las instrucciones del medicamento slo en las cajas y no directamente en los tubos del medicamento.   Si su medicamento es muy caro, por favor, pngase en contacto con nuestra oficina llamando al [phone] y presione la opcin 4 o envenos un mensaje a travs de MyChart.   No podemos decirle cul ser su copago por los medicamentos por adelantado ya que esto es diferente dependiendo de la cobertura de su seguro. Sin embargo, es posible que podamos encontrar un medicamento sustituto a menor costo o llenar un formulario para que el seguro cubra el medicamento que se considera necesario.   Si se requiere una autorizacin previa para que su compaa de seguros cubra su medicamento,   por favor permtanos de 1 a 2 das hbiles para completar este proceso.  Los precios de los medicamentos varan con frecuencia dependiendo del lugar de dnde se surte la receta y alguna farmacias pueden ofrecer precios ms baratos.  El sitio web www.goodrx.com tiene cupones para medicamentos de diferentes farmacias. Los precios aqu no tienen en cuenta lo que podra costar con la ayuda del seguro (puede ser ms barato con su seguro), pero el sitio web puede darle el precio si no utiliz ningn seguro.  - Puede imprimir el cupn correspondiente y llevarlo con su receta a la farmacia.  - Tambin puede pasar por nuestra oficina durante el horario de atencin regular y recoger una tarjeta de cupones de GoodRx.  - Si necesita que su receta se enve electrnicamente a una farmacia diferente, informe a nuestra oficina a  travs de MyChart de [place] o por telfono llamando al [phone] y presione la opcin 4.  

## 2022-09-18 ENCOUNTER — Other Ambulatory Visit: Payer: Self-pay | Admitting: Dermatology

## 2022-09-18 DIAGNOSIS — L2081 Atopic neurodermatitis: Secondary | ICD-10-CM

## 2023-01-29 ENCOUNTER — Ambulatory Visit: Payer: Medicaid Other | Admitting: Dermatology

## 2023-09-02 ENCOUNTER — Emergency Department
Admission: EM | Admit: 2023-09-02 | Discharge: 2023-09-02 | Disposition: A | Discharge status: Home / Self Care | Payer: 59 | Attending: Emergency Medicine | Admitting: Emergency Medicine

## 2023-09-02 ENCOUNTER — Other Ambulatory Visit: Payer: Self-pay

## 2023-09-02 ENCOUNTER — Emergency Department: Payer: 59

## 2023-09-02 DIAGNOSIS — J45909 Unspecified asthma, uncomplicated: Secondary | ICD-10-CM | POA: Diagnosis not present

## 2023-09-02 DIAGNOSIS — S52124A Nondisplaced fracture of head of right radius, initial encounter for closed fracture: Secondary | ICD-10-CM | POA: Diagnosis not present

## 2023-09-02 DIAGNOSIS — W1830XA Fall on same level, unspecified, initial encounter: Secondary | ICD-10-CM | POA: Diagnosis not present

## 2023-09-02 DIAGNOSIS — M79601 Pain in right arm: Secondary | ICD-10-CM | POA: Diagnosis present

## 2023-09-02 MED ORDER — OXYCODONE HCL 5 MG PO TABS
5.0000 mg | ORAL_TABLET | Freq: Once | ORAL | Status: AC
  Administered 2023-09-02: 5 mg via ORAL
  Filled 2023-09-02: qty 1

## 2023-09-02 MED ORDER — HYDROCODONE-ACETAMINOPHEN 5-325 MG PO TABS
1.0000 | ORAL_TABLET | Freq: Four times a day (QID) | ORAL | 0 refills | Status: DC | PRN
Start: 2023-09-02 — End: 2024-01-03

## 2023-09-02 NOTE — Discharge Instructions (Signed)
Call make an appointment with [facility] orthopedic department for follow-up of your fracture.  Leave the splint on until seen by the orthopedist.  Ice and elevation as needed for pain and swelling.  A prescription for pain medication was sent to the pharmacy to take as needed.  Do not drive or operate machinery while taking the pain medication as it could cause drowsiness and increase your risk for injury.

## 2023-09-02 NOTE — ED Triage Notes (Signed)
Pt to ED for fall yesterday while playing with nieces and nephews.states fell on right arm and now has difficulty moving it.

## 2023-09-02 NOTE — ED Notes (Signed)
See triage note  Presents s/p fall   States she fell yesterday while playing with children  Having pain to right arm  Good pulses

## 2023-09-02 NOTE — ED Provider Notes (Signed)
[facility] Provider Note    Event Date/Time   First MD Initiated Contact with Patient 09/02/23 1027     (approximate)   History   Fall   HPI  Kimberly Davidson is a 27 y.o. female   presents to the ED with complaint of right arm pain after a fall that occurred yesterday while she was playing with her nieces and nephews.  Patient states she fell on her right arm and is now having difficulty with movement.  Patient has a history of asthma, depression, migraine, IBS, anxiety.      Physical Exam   Triage Vital Signs: ED Triage Vitals  Encounter Vitals Group     BP 09/02/23 1014 (!) 127/91     Systolic BP Percentile --      Diastolic BP Percentile --      Pulse Rate 09/02/23 1014 81     Resp 09/02/23 1014 16     Temp 09/02/23 1014 98.9 F (37.2 C)     Temp src --      SpO2 09/02/23 1014 100 %     Weight 09/02/23 1015 150 lb (68 kg)     Height 09/02/23 1015 5\' 7"  (1.702 m)     Head Circumference --      Peak Flow --      Pain Score 09/02/23 1015 10     Pain Loc --      Pain Education --      Exclude from Growth Chart --     Most recent vital signs: Vitals:   09/02/23 1014  BP: (!) 127/91  Pulse: 81  Resp: 16  Temp: 98.9 F (37.2 C)  SpO2: 100%     General: Awake, no distress.  Alert, talkative, cooperative. CV:  Good peripheral perfusion.  Resp:  Normal effort.  Abd:  No distention.  Other:  Right upper extremity moderate tenderness on palpation with increased pain and difficulty with rotation of the forearm.  Moderate tenderness palpation of the olecranon posteriorly.  Pulses present and motor or sensory function intact distally to the injury.   ED Results / Procedures / Treatments   Labs (all labs ordered are listed, but only abnormal results are displayed) Labs Reviewed - No data to display   RADIOLOGY X-ray images of the right elbow/forearm were reviewed and interpreted by myself independent of the radiologist and showed a  nondisplaced fracture of the radial head.    PROCEDURES:  Critical Care performed:   Procedures   MEDICATIONS ORDERED IN ED: Medications  oxyCODONE (Oxy IR/ROXICODONE) immediate release tablet 5 mg (5 mg Oral Given 09/02/23 1126)     IMPRESSION / MDM / ASSESSMENT AND PLAN / ED COURSE  I reviewed the triage vital signs and the nursing notes.   Differential diagnosis includes, but is not limited to, contusion right elbow/fracture, dislocation, strain.  27 year old female presents to the ED with injury to her right elbow that occurred yesterday when she fell.  X-ray is consistent with a fracture radial head and patient was made aware.  She was given oxycodone IR 5 mg p.o. while in the ED.  A prescription for hydrocodone was sent to the pharmacy.  She was made aware that she does have a fracture will need to follow-up with [facility] orthopedic department.  A sugar-tong splint was applied along with a sling for support.  She is instructed to ice and elevate as needed for pain and to reduce any swelling.  Clinical Course as of 09/02/23 1251  Mon Sep 02, 2023  1036 DG Elbow Complete Right [HD]    Clinical Course User Index [HD] Lynda Rainwater, Student-PA   Patient's presentation is most consistent with acute complicated illness / injury requiring diagnostic workup.  FINAL CLINICAL IMPRESSION(S) / ED DIAGNOSES   Final diagnoses:  Closed nondisplaced fracture of head of right radius, initial encounter     Rx / DC Orders   ED Discharge Orders          Ordered    HYDROcodone-acetaminophen (NORCO/VICODIN) 5-325 MG tablet  Every 6 hours PRN        09/02/23 1214             Note:  This document was prepared using Dragon voice recognition software and may include unintentional dictation errors.   Tommi Rumps, PA-C 09/02/23 1251    Trinna Post, MD 09/03/23 [phone]

## 2023-11-01 ENCOUNTER — Ambulatory Visit: Payer: 59 | Attending: Student | Admitting: Occupational Therapy

## 2023-11-01 ENCOUNTER — Encounter: Payer: Self-pay | Admitting: Occupational Therapy

## 2023-11-01 DIAGNOSIS — M25521 Pain in right elbow: Secondary | ICD-10-CM | POA: Insufficient documentation

## 2023-11-01 DIAGNOSIS — M25621 Stiffness of right elbow, not elsewhere classified: Secondary | ICD-10-CM | POA: Diagnosis present

## 2023-11-01 DIAGNOSIS — M25611 Stiffness of right shoulder, not elsewhere classified: Secondary | ICD-10-CM | POA: Insufficient documentation

## 2023-11-01 DIAGNOSIS — M6281 Muscle weakness (generalized): Secondary | ICD-10-CM | POA: Diagnosis present
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
## 2023-11-04 ENCOUNTER — Encounter: Payer: Self-pay | Admitting: Occupational Therapy

## 2023-11-05 ENCOUNTER — Encounter: Payer: 59 | Admitting: Occupational Therapy

## 2023-11-12 ENCOUNTER — Ambulatory Visit: Payer: 59 | Admitting: Occupational Therapy

## 2023-11-14 ENCOUNTER — Ambulatory Visit: Payer: 59 | Admitting: Occupational Therapy

## 2024-01-03 ENCOUNTER — Other Ambulatory Visit (HOSPITAL_COMMUNITY)
Admission: RE | Admit: 2024-01-03 | Discharge: 2024-01-03 | Disposition: A | Discharge status: Home / Self Care | Payer: 59 | Source: Ambulatory Visit | Attending: Family Medicine | Admitting: Family Medicine

## 2024-01-03 ENCOUNTER — Ambulatory Visit (INDEPENDENT_AMBULATORY_CARE_PROVIDER_SITE_OTHER): Payer: 59 | Admitting: Family Medicine

## 2024-01-03 VITALS — BP 124/82 | HR 94 | Temp 98.3°F | Resp 16 | Ht 67.0 in | Wt 142.3 lb

## 2024-01-03 DIAGNOSIS — Z23 Encounter for immunization: Secondary | ICD-10-CM | POA: Diagnosis not present

## 2024-01-03 DIAGNOSIS — J3089 Other allergic rhinitis: Secondary | ICD-10-CM | POA: Diagnosis not present

## 2024-01-03 DIAGNOSIS — Z862 Personal history of diseases of the blood and blood-forming organs and certain disorders involving the immune mechanism: Secondary | ICD-10-CM

## 2024-01-03 DIAGNOSIS — E538 Deficiency of other specified B group vitamins: Secondary | ICD-10-CM

## 2024-01-03 DIAGNOSIS — D500 Iron deficiency anemia secondary to blood loss (chronic): Secondary | ICD-10-CM

## 2024-01-03 DIAGNOSIS — Z13220 Encounter for screening for lipoid disorders: Secondary | ICD-10-CM

## 2024-01-03 DIAGNOSIS — F3181 Bipolar II disorder: Secondary | ICD-10-CM

## 2024-01-03 DIAGNOSIS — E559 Vitamin D deficiency, unspecified: Secondary | ICD-10-CM

## 2024-01-03 DIAGNOSIS — J4520 Mild intermittent asthma, uncomplicated: Secondary | ICD-10-CM | POA: Diagnosis not present

## 2024-01-03 DIAGNOSIS — L239 Allergic contact dermatitis, unspecified cause: Secondary | ICD-10-CM

## 2024-01-03 DIAGNOSIS — Z79899 Other long term (current) drug therapy: Secondary | ICD-10-CM

## 2024-01-03 DIAGNOSIS — B001 Herpesviral vesicular dermatitis: Secondary | ICD-10-CM

## 2024-01-03 DIAGNOSIS — J302 Other seasonal allergic rhinitis: Secondary | ICD-10-CM

## 2024-01-03 DIAGNOSIS — Z131 Encounter for screening for diabetes mellitus: Secondary | ICD-10-CM

## 2024-01-03 DIAGNOSIS — Z113 Encounter for screening for infections with a predominantly sexual mode of transmission: Secondary | ICD-10-CM

## 2024-01-03 MED ORDER — VALACYCLOVIR HCL 1 G PO TABS
1000.0000 mg | ORAL_TABLET | Freq: Two times a day (BID) | ORAL | 0 refills | Status: AC
Start: 2024-01-03 — End: ?

## 2024-01-03 MED ORDER — FLUTICASONE PROPIONATE 50 MCG/ACT NA SUSP: NASAL | 0 refills | Status: AC

## 2024-01-03 MED ORDER — FERROUS SULFATE 325 (65 FE) MG PO TBEC: 325.0000 mg | DELAYED_RELEASE_TABLET | Freq: Three times a day (TID) | ORAL | 3 refills | Status: AC

## 2024-01-03 MED ORDER — CETIRIZINE HCL 10 MG PO TABS
10.0000 mg | ORAL_TABLET | Freq: Every day | ORAL | 1 refills | Status: AC
Start: 2024-01-03 — End: ?
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
## 2024-01-04 LAB — CBC WITH DIFFERENTIAL/PLATELET
Absolute Lymphocytes: 1586 {cells}/uL (ref 850–3900)
Absolute Monocytes: 697 {cells}/uL (ref 200–950)
Basophils Absolute: 30 {cells}/uL (ref 0–200)
Basophils Relative: 0.3 %
Eosinophils Absolute: 10 {cells}/uL — ABNORMAL LOW (ref 15–500)
Eosinophils Relative: 0.1 %
HCT: 43.4 % (ref 35.0–45.0)
Hemoglobin: 14.3 g/dL (ref 11.7–15.5)
MCH: 31.4 pg (ref 27.0–33.0)
MCHC: 32.9 g/dL (ref 32.0–36.0)
MCV: 95.2 fL (ref 80.0–100.0)
MPV: 12.9 fL — ABNORMAL HIGH (ref 7.5–12.5)
Monocytes Relative: 6.9 %
Neutro Abs: 7777 {cells}/uL (ref 1500–7800)
Neutrophils Relative %: 77 %
Platelets: 355 10*3/uL (ref 140–400)
RBC: 4.56 10*6/uL (ref 3.80–5.10)
RDW: 13.1 % (ref 11.0–15.0)
Total Lymphocyte: 15.7 %
WBC: 10.1 10*3/uL (ref 3.8–10.8)

## 2024-01-04 LAB — LIPID PANEL
Cholesterol: 160 mg/dL (ref <200)
HDL: 69 mg/dL (ref >=50)
LDL Cholesterol (Calc): 73 mg/dL
Non-HDL Cholesterol (Calc): 91 mg/dL (ref <130)
Total CHOL/HDL Ratio: 2.3 (calc) (ref <5.0)
Triglycerides: 93 mg/dL (ref <150)

## 2024-01-04 LAB — COMPLETE METABOLIC PANEL WITH GFR
AG Ratio: 1.7 (calc) (ref 1.0–2.5)
ALT: 50 U/L — ABNORMAL HIGH (ref 6–29)
AST: 54 U/L — ABNORMAL HIGH (ref 10–30)
Albumin: 4.8 g/dL (ref 3.6–5.1)
Alkaline phosphatase (APISO): 74 U/L (ref 31–125)
BUN/Creatinine Ratio: 9 (calc) (ref 6–22)
BUN: 6 mg/dL — ABNORMAL LOW (ref 7–25)
CO2: 25 mmol/L (ref 20–32)
Calcium: 10.5 mg/dL — ABNORMAL HIGH (ref 8.6–10.2)
Chloride: 102 mmol/L (ref 98–110)
Creat: 0.7 mg/dL (ref 0.50–0.96)
Globulin: 2.8 g/dL (ref 1.9–3.7)
Glucose, Bld: 78 mg/dL (ref 65–99)
Potassium: 4.6 mmol/L (ref 3.5–5.3)
Sodium: 138 mmol/L (ref 135–146)
Total Bilirubin: 0.7 mg/dL (ref 0.2–1.2)
Total Protein: 7.6 g/dL (ref 6.1–8.1)
eGFR: 122 mL/min/{1.73_m2} (ref >=60)

## 2024-01-04 LAB — VITAMIN D 25 HYDROXY (VIT D DEFICIENCY, FRACTURES): Vit D, 25-Hydroxy: 9 ng/mL — ABNORMAL LOW (ref 30–100)

## 2024-01-04 LAB — HEMOGLOBIN A1C
Hgb A1c MFr Bld: 5.3 %{Hb} (ref <5.7)
Mean Plasma Glucose: 105 mg/dL
eAG (mmol/L): 5.8 mmol/L

## 2024-01-04 LAB — IRON,TIBC AND FERRITIN PANEL
%SAT: 36 % (ref 16–45)
Ferritin: 114 ng/mL (ref 16–154)
Iron: 128 ug/dL (ref 40–190)
TIBC: 359 ug/dL (ref 250–450)

## 2024-01-04 LAB — RPR: RPR Ser Ql: NONREACTIVE

## 2024-01-04 LAB — HIV ANTIBODY (ROUTINE TESTING W REFLEX): HIV 1&2 Ab, 4th Generation: NONREACTIVE

## 2024-01-06 ENCOUNTER — Other Ambulatory Visit: Payer: Self-pay | Admitting: Family Medicine

## 2024-01-06 ENCOUNTER — Encounter: Payer: Self-pay | Admitting: Family Medicine

## 2024-01-07 LAB — CERVICOVAGINAL ANCILLARY ONLY
Chlamydia: NEGATIVE
Comment: NEGATIVE
Comment: NEGATIVE
Comment: NORMAL
Neisseria Gonorrhea: NEGATIVE
Trichomonas: NEGATIVE

## 2024-03-04 ENCOUNTER — Ambulatory Visit: Payer: 59 | Admitting: Dermatology

## 2024-03-10 ENCOUNTER — Encounter: Payer: Self-pay | Admitting: Family Medicine

## 2024-03-10 ENCOUNTER — Other Ambulatory Visit (HOSPITAL_COMMUNITY)
Admission: RE | Admit: 2024-03-10 | Discharge: 2024-03-10 | Disposition: A | Discharge status: Home / Self Care | Source: Ambulatory Visit | Attending: Family Medicine | Admitting: Family Medicine

## 2024-03-10 ENCOUNTER — Ambulatory Visit (INDEPENDENT_AMBULATORY_CARE_PROVIDER_SITE_OTHER): Payer: 59 | Admitting: Family Medicine

## 2024-03-10 VITALS — BP 102/68 | HR 95 | Resp 16 | Ht 67.0 in | Wt 147.2 lb

## 2024-03-10 DIAGNOSIS — Z0000 Encounter for general adult medical examination without abnormal findings: Secondary | ICD-10-CM

## 2024-03-10 DIAGNOSIS — Z124 Encounter for screening for malignant neoplasm of cervix: Secondary | ICD-10-CM

## 2024-03-10 DIAGNOSIS — Z0001 Encounter for general adult medical examination with abnormal findings: Secondary | ICD-10-CM | POA: Diagnosis not present

## 2024-03-10 DIAGNOSIS — Z23 Encounter for immunization: Secondary | ICD-10-CM

## 2024-03-10 DIAGNOSIS — R748 Abnormal levels of other serum enzymes: Secondary | ICD-10-CM | POA: Diagnosis not present
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
## 2024-03-11 LAB — PTH, INTACT AND CALCIUM
Calcium: 9.9 mg/dL (ref 8.6–10.2)
PTH: 26 pg/mL (ref 16–77)

## 2024-03-11 LAB — HEPATIC FUNCTION PANEL
AG Ratio: 2 (calc) (ref 1.0–2.5)
ALT: 34 U/L — ABNORMAL HIGH (ref 6–29)
AST: 27 U/L (ref 10–30)
Albumin: 4.5 g/dL (ref 3.6–5.1)
Alkaline phosphatase (APISO): 64 U/L (ref 31–125)
Bilirubin, Direct: 0.1 mg/dL (ref 0.0–0.2)
Globulin: 2.3 g/dL (ref 1.9–3.7)
Indirect Bilirubin: 0.2 mg/dL (ref 0.2–1.2)
Total Bilirubin: 0.3 mg/dL (ref 0.2–1.2)
Total Protein: 6.8 g/dL (ref 6.1–8.1)

## 2024-03-12 ENCOUNTER — Encounter: Payer: Self-pay | Admitting: Family Medicine

## 2024-03-12 LAB — CYTOLOGY - PAP
Chlamydia: NEGATIVE
Comment: NEGATIVE
Comment: NORMAL
Diagnosis: NEGATIVE
Neisseria Gonorrhea: NEGATIVE

## 2024-09-08 ENCOUNTER — Ambulatory Visit: Admitting: Family Medicine

## 2024-09-23 ENCOUNTER — Telehealth: Admitting: Physician Assistant

## 2024-09-23 DIAGNOSIS — R21 Rash and other nonspecific skin eruption: Secondary | ICD-10-CM

## 2024-09-23 DIAGNOSIS — B001 Herpesviral vesicular dermatitis: Secondary | ICD-10-CM

## 2024-09-23 NOTE — Progress Notes (Signed)
 [place]

## 2025-03-11 ENCOUNTER — Encounter: Admitting: Family Medicine
